# Patient Record
Sex: Female | Born: 1955 | Race: White | Hispanic: No | Marital: Married | Smoking: Never smoker
Health system: Southern US, Community
[De-identification: ages and names within clinical notes are randomized; demographics above are authoritative.]

## PROBLEM LIST (undated history)

## (undated) DIAGNOSIS — F329 Major depressive disorder, single episode, unspecified: Secondary | ICD-10-CM

## (undated) DIAGNOSIS — I10 Essential (primary) hypertension: Secondary | ICD-10-CM

## (undated) DIAGNOSIS — C801 Malignant (primary) neoplasm, unspecified: Secondary | ICD-10-CM

## (undated) DIAGNOSIS — E785 Hyperlipidemia, unspecified: Secondary | ICD-10-CM

## (undated) DIAGNOSIS — E079 Disorder of thyroid, unspecified: Secondary | ICD-10-CM

## (undated) DIAGNOSIS — F32A Depression, unspecified: Secondary | ICD-10-CM

## (undated) HISTORY — DX: Disorder of thyroid, unspecified: E07.9

## (undated) HISTORY — DX: Essential (primary) hypertension: I10

## (undated) HISTORY — PX: UPPER GASTROINTESTINAL ENDOSCOPY: SHX188

## (undated) HISTORY — DX: Hyperlipidemia, unspecified: E78.5

## (undated) HISTORY — DX: Malignant (primary) neoplasm, unspecified: C80.1

## (undated) HISTORY — PX: SKIN CANCER EXCISION: SHX779

## (undated) HISTORY — DX: Depression, unspecified: F32.A

---
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
## 1998-08-27 ENCOUNTER — Ambulatory Visit (HOSPITAL_COMMUNITY): Admission: RE | Admit: 1998-08-27 | Discharge: 1998-08-27 | Payer: Self-pay | Admitting: Obstetrics and Gynecology

## 1999-05-12 ENCOUNTER — Other Ambulatory Visit: Admission: RE | Admit: 1999-05-12 | Discharge: 1999-05-12 | Payer: Self-pay | Admitting: Obstetrics and Gynecology

## 1999-09-01 ENCOUNTER — Encounter: Payer: Self-pay | Admitting: Obstetrics and Gynecology

## 1999-09-01 ENCOUNTER — Ambulatory Visit (HOSPITAL_COMMUNITY): Admission: RE | Admit: 1999-09-01 | Discharge: 1999-09-01 | Payer: Self-pay | Admitting: Obstetrics and Gynecology

## 2000-06-21 ENCOUNTER — Other Ambulatory Visit: Admission: RE | Admit: 2000-06-21 | Discharge: 2000-06-21 | Payer: Self-pay | Admitting: Obstetrics and Gynecology

## 2000-09-06 ENCOUNTER — Encounter: Payer: Self-pay | Admitting: Obstetrics and Gynecology

## 2000-09-06 ENCOUNTER — Ambulatory Visit (HOSPITAL_COMMUNITY): Admission: RE | Admit: 2000-09-06 | Discharge: 2000-09-06 | Payer: Self-pay | Admitting: Obstetrics and Gynecology

## 2000-11-10 ENCOUNTER — Other Ambulatory Visit: Admission: RE | Admit: 2000-11-10 | Discharge: 2000-11-10 | Payer: Self-pay | Admitting: Obstetrics and Gynecology

## 2001-09-22 ENCOUNTER — Other Ambulatory Visit: Admission: RE | Admit: 2001-09-22 | Discharge: 2001-09-22 | Payer: Self-pay | Admitting: Obstetrics and Gynecology

## 2002-10-26 ENCOUNTER — Other Ambulatory Visit: Admission: RE | Admit: 2002-10-26 | Discharge: 2002-10-26 | Payer: Self-pay | Admitting: Obstetrics and Gynecology

## 2003-11-05 ENCOUNTER — Other Ambulatory Visit: Admission: RE | Admit: 2003-11-05 | Discharge: 2003-11-05 | Payer: Self-pay | Admitting: Obstetrics and Gynecology

## 2004-11-27 ENCOUNTER — Other Ambulatory Visit: Admission: RE | Admit: 2004-11-27 | Discharge: 2004-11-27 | Payer: Self-pay | Admitting: Obstetrics and Gynecology

## 2006-02-17 ENCOUNTER — Other Ambulatory Visit: Admission: RE | Admit: 2006-02-17 | Discharge: 2006-02-17 | Payer: Self-pay | Admitting: Obstetrics and Gynecology

## 2008-05-08 ENCOUNTER — Ambulatory Visit: Payer: Self-pay | Admitting: Gastroenterology

## 2008-05-08 DIAGNOSIS — L255 Unspecified contact dermatitis due to plants, except food: Secondary | ICD-10-CM | POA: Insufficient documentation

## 2008-05-08 DIAGNOSIS — K589 Irritable bowel syndrome without diarrhea: Secondary | ICD-10-CM | POA: Insufficient documentation

## 2008-05-08 DIAGNOSIS — K644 Residual hemorrhoidal skin tags: Secondary | ICD-10-CM | POA: Insufficient documentation

## 2008-05-08 DIAGNOSIS — K625 Hemorrhage of anus and rectum: Secondary | ICD-10-CM | POA: Insufficient documentation

## 2008-05-14 ENCOUNTER — Ambulatory Visit: Payer: Self-pay | Admitting: Gastroenterology

## 2008-05-14 ENCOUNTER — Encounter: Payer: Self-pay | Admitting: Gastroenterology

## 2008-05-14 HISTORY — PX: COLONOSCOPY: SHX174

## 2008-05-17 ENCOUNTER — Encounter: Payer: Self-pay | Admitting: Gastroenterology

## 2008-05-23 ENCOUNTER — Encounter: Payer: Self-pay | Admitting: Gastroenterology

## 2008-05-23 ENCOUNTER — Ambulatory Visit: Payer: Self-pay | Admitting: Gastroenterology

## 2008-05-25 ENCOUNTER — Encounter: Payer: Self-pay | Admitting: Gastroenterology

## 2012-08-03 ENCOUNTER — Other Ambulatory Visit: Payer: Self-pay | Admitting: Dermatology

## 2014-02-08 ENCOUNTER — Other Ambulatory Visit: Payer: Self-pay | Admitting: Obstetrics and Gynecology

## 2014-02-08 DIAGNOSIS — R928 Other abnormal and inconclusive findings on diagnostic imaging of breast: Secondary | ICD-10-CM

## 2014-02-09 ENCOUNTER — Ambulatory Visit
Admission: RE | Admit: 2014-02-09 | Discharge: 2014-02-09 | Disposition: A | Discharge status: Home / Self Care | Payer: BC Managed Care – PPO | Source: Ambulatory Visit | Attending: Obstetrics and Gynecology | Admitting: Obstetrics and Gynecology

## 2014-02-09 DIAGNOSIS — R928 Other abnormal and inconclusive findings on diagnostic imaging of breast: Secondary | ICD-10-CM

## 2014-02-15 ENCOUNTER — Other Ambulatory Visit: Payer: Self-pay

## 2015-03-13 ENCOUNTER — Other Ambulatory Visit: Payer: Self-pay | Admitting: Obstetrics and Gynecology

## 2015-03-14 LAB — CYTOLOGY - PAP

## 2015-09-09 ENCOUNTER — Other Ambulatory Visit: Payer: Self-pay

## 2016-02-04 DIAGNOSIS — R030 Elevated blood-pressure reading, without diagnosis of hypertension: Secondary | ICD-10-CM | POA: Insufficient documentation

## 2016-02-10 DIAGNOSIS — F419 Anxiety disorder, unspecified: Secondary | ICD-10-CM | POA: Insufficient documentation

## 2016-02-10 DIAGNOSIS — E039 Hypothyroidism, unspecified: Secondary | ICD-10-CM | POA: Insufficient documentation

## 2016-05-25 DIAGNOSIS — E039 Hypothyroidism, unspecified: Secondary | ICD-10-CM | POA: Diagnosis not present

## 2016-05-25 DIAGNOSIS — I10 Essential (primary) hypertension: Secondary | ICD-10-CM | POA: Diagnosis not present

## 2016-06-02 DIAGNOSIS — E7800 Pure hypercholesterolemia, unspecified: Secondary | ICD-10-CM | POA: Diagnosis not present

## 2016-06-02 DIAGNOSIS — I10 Essential (primary) hypertension: Secondary | ICD-10-CM | POA: Diagnosis not present

## 2016-06-02 DIAGNOSIS — E039 Hypothyroidism, unspecified: Secondary | ICD-10-CM | POA: Diagnosis not present

## 2016-06-04 DIAGNOSIS — H524 Presbyopia: Secondary | ICD-10-CM | POA: Diagnosis not present

## 2016-06-04 DIAGNOSIS — H5203 Hypermetropia, bilateral: Secondary | ICD-10-CM | POA: Diagnosis not present

## 2016-08-11 DIAGNOSIS — H10411 Chronic giant papillary conjunctivitis, right eye: Secondary | ICD-10-CM | POA: Diagnosis not present

## 2016-10-06 ENCOUNTER — Telehealth: Payer: Self-pay | Admitting: Internal Medicine

## 2016-10-06 NOTE — Telephone Encounter (Signed)
Colon recall is 04/2018

## 2016-10-06 NOTE — Telephone Encounter (Signed)
Recall entered  Left message for patient to call back

## 2016-10-06 NOTE — Telephone Encounter (Signed)
Please review colonoscopy from 2009 and indicate correct recall date.

## 2016-10-12 NOTE — Telephone Encounter (Signed)
Patient notified

## 2016-12-15 DIAGNOSIS — Z23 Encounter for immunization: Secondary | ICD-10-CM | POA: Diagnosis not present

## 2017-02-10 DIAGNOSIS — I10 Essential (primary) hypertension: Secondary | ICD-10-CM | POA: Diagnosis not present

## 2017-04-21 DIAGNOSIS — D229 Melanocytic nevi, unspecified: Secondary | ICD-10-CM | POA: Diagnosis not present

## 2017-04-21 DIAGNOSIS — L814 Other melanin hyperpigmentation: Secondary | ICD-10-CM | POA: Diagnosis not present

## 2017-04-21 DIAGNOSIS — L821 Other seborrheic keratosis: Secondary | ICD-10-CM | POA: Diagnosis not present

## 2017-08-02 DIAGNOSIS — Z01419 Encounter for gynecological examination (general) (routine) without abnormal findings: Secondary | ICD-10-CM | POA: Diagnosis not present

## 2017-08-02 DIAGNOSIS — Z1231 Encounter for screening mammogram for malignant neoplasm of breast: Secondary | ICD-10-CM | POA: Diagnosis not present

## 2017-08-02 DIAGNOSIS — Z13820 Encounter for screening for osteoporosis: Secondary | ICD-10-CM | POA: Diagnosis not present

## 2017-08-02 DIAGNOSIS — Z6826 Body mass index (BMI) 26.0-26.9, adult: Secondary | ICD-10-CM | POA: Diagnosis not present

## 2017-10-13 DIAGNOSIS — H524 Presbyopia: Secondary | ICD-10-CM | POA: Diagnosis not present

## 2017-10-13 DIAGNOSIS — H5203 Hypermetropia, bilateral: Secondary | ICD-10-CM | POA: Diagnosis not present

## 2018-02-08 DIAGNOSIS — I10 Essential (primary) hypertension: Secondary | ICD-10-CM | POA: Diagnosis not present

## 2018-02-08 DIAGNOSIS — F329 Major depressive disorder, single episode, unspecified: Secondary | ICD-10-CM | POA: Diagnosis not present

## 2018-02-08 DIAGNOSIS — F419 Anxiety disorder, unspecified: Secondary | ICD-10-CM | POA: Diagnosis not present

## 2018-02-08 DIAGNOSIS — E039 Hypothyroidism, unspecified: Secondary | ICD-10-CM | POA: Diagnosis not present

## 2018-02-15 DIAGNOSIS — I10 Essential (primary) hypertension: Secondary | ICD-10-CM | POA: Diagnosis not present

## 2018-02-15 DIAGNOSIS — Z13220 Encounter for screening for lipoid disorders: Secondary | ICD-10-CM | POA: Diagnosis not present

## 2018-02-15 DIAGNOSIS — E039 Hypothyroidism, unspecified: Secondary | ICD-10-CM | POA: Diagnosis not present

## 2018-02-21 ENCOUNTER — Other Ambulatory Visit: Payer: Self-pay | Admitting: Dermatology

## 2018-02-21 DIAGNOSIS — D0439 Carcinoma in situ of skin of other parts of face: Secondary | ICD-10-CM | POA: Diagnosis not present

## 2018-02-21 DIAGNOSIS — D485 Neoplasm of uncertain behavior of skin: Secondary | ICD-10-CM | POA: Diagnosis not present

## 2018-04-21 DIAGNOSIS — D0439 Carcinoma in situ of skin of other parts of face: Secondary | ICD-10-CM | POA: Diagnosis not present

## 2018-05-30 DIAGNOSIS — I10 Essential (primary) hypertension: Secondary | ICD-10-CM | POA: Diagnosis not present

## 2018-06-03 DIAGNOSIS — Z0000 Encounter for general adult medical examination without abnormal findings: Secondary | ICD-10-CM | POA: Diagnosis not present

## 2018-06-22 ENCOUNTER — Encounter: Payer: Self-pay | Admitting: Internal Medicine

## 2018-07-04 DIAGNOSIS — D0439 Carcinoma in situ of skin of other parts of face: Secondary | ICD-10-CM | POA: Diagnosis not present

## 2018-08-03 DIAGNOSIS — Z01419 Encounter for gynecological examination (general) (routine) without abnormal findings: Secondary | ICD-10-CM | POA: Diagnosis not present

## 2018-08-03 DIAGNOSIS — Z1231 Encounter for screening mammogram for malignant neoplasm of breast: Secondary | ICD-10-CM | POA: Diagnosis not present

## 2018-08-03 DIAGNOSIS — Z6826 Body mass index (BMI) 26.0-26.9, adult: Secondary | ICD-10-CM | POA: Diagnosis not present

## 2018-09-26 DIAGNOSIS — D0439 Carcinoma in situ of skin of other parts of face: Secondary | ICD-10-CM | POA: Diagnosis not present

## 2018-10-24 ENCOUNTER — Other Ambulatory Visit: Payer: Self-pay

## 2019-05-04 ENCOUNTER — Other Ambulatory Visit: Payer: Self-pay

## 2019-05-04 ENCOUNTER — Telehealth: Payer: Self-pay | Admitting: Psychiatry

## 2019-05-04 MED ORDER — ALPRAZOLAM 1 MG PO TABS: ORAL_TABLET | ORAL | 0 refills | Status: DC

## 2019-05-04 NOTE — Telephone Encounter (Signed)
Will need to pull paper chart no records in epic

## 2019-05-04 NOTE — Telephone Encounter (Signed)
Submitted for Alprazolam 1mg  take as needed for flying, #30

## 2019-05-04 NOTE — Telephone Encounter (Signed)
Pt [facility] [place] [place]. She has scheduled an appt for august.

## 2019-05-10 DIAGNOSIS — Z20828 Contact with and (suspected) exposure to other viral communicable diseases: Secondary | ICD-10-CM | POA: Diagnosis not present

## 2019-06-06 DIAGNOSIS — E785 Hyperlipidemia, unspecified: Secondary | ICD-10-CM | POA: Diagnosis not present

## 2019-06-06 DIAGNOSIS — E039 Hypothyroidism, unspecified: Secondary | ICD-10-CM | POA: Diagnosis not present

## 2019-06-06 DIAGNOSIS — I10 Essential (primary) hypertension: Secondary | ICD-10-CM | POA: Diagnosis not present

## 2019-06-06 DIAGNOSIS — Z0000 Encounter for general adult medical examination without abnormal findings: Secondary | ICD-10-CM | POA: Diagnosis not present

## 2019-06-09 DIAGNOSIS — Z0000 Encounter for general adult medical examination without abnormal findings: Secondary | ICD-10-CM | POA: Diagnosis not present

## 2019-06-15 DIAGNOSIS — R17 Unspecified jaundice: Secondary | ICD-10-CM | POA: Diagnosis not present

## 2019-07-04 ENCOUNTER — Ambulatory Visit: Payer: Self-pay | Admitting: Psychiatry

## 2019-07-12 DIAGNOSIS — R600 Localized edema: Secondary | ICD-10-CM | POA: Diagnosis not present

## 2019-07-12 DIAGNOSIS — I8312 Varicose veins of left lower extremity with inflammation: Secondary | ICD-10-CM | POA: Diagnosis not present

## 2019-07-12 DIAGNOSIS — I8311 Varicose veins of right lower extremity with inflammation: Secondary | ICD-10-CM | POA: Diagnosis not present

## 2019-08-03 ENCOUNTER — Encounter: Payer: Self-pay | Admitting: Internal Medicine

## 2019-08-15 DIAGNOSIS — Z1231 Encounter for screening mammogram for malignant neoplasm of breast: Secondary | ICD-10-CM | POA: Diagnosis not present

## 2019-08-22 DIAGNOSIS — Z23 Encounter for immunization: Secondary | ICD-10-CM | POA: Diagnosis not present

## 2019-08-23 ENCOUNTER — Telehealth: Payer: Self-pay | Admitting: Psychiatry

## 2019-08-23 ENCOUNTER — Other Ambulatory Visit: Payer: Self-pay

## 2019-08-23 ENCOUNTER — Other Ambulatory Visit: Payer: Self-pay | Admitting: Psychiatry

## 2019-08-23 NOTE — Telephone Encounter (Signed)
Pt requesting a refill on her Lexapro. Appt scheduled for 11/5. [facility] [place] [facility]

## 2019-08-24 ENCOUNTER — Other Ambulatory Visit: Payer: Self-pay

## 2019-08-24 DIAGNOSIS — Z13820 Encounter for screening for osteoporosis: Secondary | ICD-10-CM | POA: Diagnosis not present

## 2019-08-24 DIAGNOSIS — Z6825 Body mass index (BMI) 25.0-25.9, adult: Secondary | ICD-10-CM | POA: Diagnosis not present

## 2019-08-24 DIAGNOSIS — Z01419 Encounter for gynecological examination (general) (routine) without abnormal findings: Secondary | ICD-10-CM | POA: Diagnosis not present

## 2019-08-24 NOTE — Telephone Encounter (Signed)
I will send her order for lexapro 20 mg via fax, unable to submit through epic

## 2019-09-01 ENCOUNTER — Encounter (INDEPENDENT_AMBULATORY_CARE_PROVIDER_SITE_OTHER): Payer: Self-pay

## 2019-09-01 ENCOUNTER — Ambulatory Visit (AMBULATORY_SURGERY_CENTER): Payer: Self-pay | Admitting: *Deleted

## 2019-09-01 ENCOUNTER — Encounter: Payer: Self-pay | Admitting: Internal Medicine

## 2019-09-01 ENCOUNTER — Other Ambulatory Visit: Payer: Self-pay

## 2019-09-01 VITALS — Temp 96.6°F | Ht 69.75 in | Wt 179.0 lb

## 2019-09-01 DIAGNOSIS — Z1211 Encounter for screening for malignant neoplasm of colon: Secondary | ICD-10-CM

## 2019-09-01 NOTE — Progress Notes (Signed)
Patient is here in-person for PV. Patient denies any allergies to eggs or soy. Patient denies any problems with anesthesia/sedation. Patient denies any oxygen use at home. Patient denies taking any diet/weight loss medications or blood thinners. Patient is not being treated for MRSA or C-diff. EMMI education assisgned to patient on colonoscopy, this was explained and instructions given to patient.   Pt is aware that care partner will wait in the car during procedure; if they feel like they will be too hot or cold to wait in the car; they may wait in the 4 th floor lobby. Patient is aware to bring only one care partner. We want them to wear a mask (we do not have any that we can provide them), practice social distancing, and we will check their temperatures when they get here.  I did remind the patient that their care partner needs to stay in the parking lot the entire time and have a cell phone available, we will call them when the pt is ready for discharge. Patient will wear mask into building.   Patient declined Covid testing at this time.

## 2019-09-11 ENCOUNTER — Telehealth: Payer: Self-pay

## 2019-09-11 NOTE — Telephone Encounter (Signed)
Covid-19 screening questions   Do you now or have you had a fever in the last 14 days? NO   Do you have any respiratory symptoms of shortness of breath or cough now or in the last 14 days? NO  Do you have any family members or close contacts with diagnosed or suspected Covid-19 in the past 14 days? NO  Have you been tested for Covid-19 and found to be positive? NO        

## 2019-09-12 ENCOUNTER — Telehealth: Payer: Self-pay

## 2019-09-12 ENCOUNTER — Other Ambulatory Visit: Payer: Self-pay

## 2019-09-12 ENCOUNTER — Encounter: Payer: Self-pay | Admitting: Internal Medicine

## 2019-09-12 ENCOUNTER — Ambulatory Visit (AMBULATORY_SURGERY_CENTER): Payer: BC Managed Care – PPO | Admitting: Internal Medicine

## 2019-09-12 VITALS — BP 106/76 | HR 59 | Temp 97.7°F | Resp 16 | Ht 69.75 in | Wt 179.0 lb

## 2019-09-12 DIAGNOSIS — K625 Hemorrhage of anus and rectum: Secondary | ICD-10-CM | POA: Diagnosis not present

## 2019-09-12 DIAGNOSIS — Z1211 Encounter for screening for malignant neoplasm of colon: Secondary | ICD-10-CM | POA: Diagnosis not present

## 2019-09-12 MED ORDER — SODIUM CHLORIDE 0.9 % IV SOLN: 500.0000 mL | Freq: Once | INTRAVENOUS | Status: DC

## 2019-09-12 NOTE — Telephone Encounter (Signed)
Follow up call attempted.  NALM  

## 2019-09-12 NOTE — Op Note (Signed)
[place] Patient Name: Angel Cox Procedure Date: 09/12/2019 8:36 AM MRN: [id number] Endoscopist: Gatha Mayer , MD Age: 63 Referring MD:  Date of Birth: 1955/12/21 Gender: Female Account #: [id number] Procedure:                Colonoscopy Indications:              Screening for colorectal malignant neoplasm, Last                            colonoscopy: 2009 Medicines:                Propofol per Anesthesia, Monitored Anesthesia Care Procedure:                Pre-Anesthesia Assessment:                           - Prior to the procedure, a History and Physical                            was performed, and patient medications and                            allergies were reviewed. The patient's tolerance of                            previous anesthesia was also reviewed. The risks                            and benefits of the procedure and the sedation                            options and risks were discussed with the patient.                            All questions were answered, and informed consent                            was obtained. Prior Anticoagulants: The patient has                            taken no previous anticoagulant or antiplatelet                            agents. ASA Grade Assessment: II - A patient with                            mild systemic disease. After reviewing the risks                            and benefits, the patient was deemed in                            satisfactory condition to undergo the procedure.  After obtaining informed consent, the colonoscope                            was passed under direct vision. Throughout the                            procedure, the patient's blood pressure, pulse, and                            oxygen saturations were monitored continuously. The                            Colonoscope was introduced through the anus and                            advanced to the  the cecum, identified by                            appendiceal orifice and ileocecal valve. The                            quality of the bowel preparation was good. The                            colonoscopy was performed without difficulty. The                            patient tolerated the procedure well. The bowel                            preparation used was Miralax via split dose                            instruction. Scope In: 8:51:14 AM Scope Out: 9:03:37 AM Scope Withdrawal Time: 0 hours 9 minutes 59 seconds  Total Procedure Duration: 0 hours 12 minutes 23 seconds  Findings:                 The perianal and digital rectal examinations were                            normal.                           The colon (entire examined portion) appeared normal.                           No additional abnormalities were found on                            retroflexion. Complications:            No immediate complications. Estimated blood loss:                            None. Estimated Blood Loss:     Estimated  blood loss: none. Recommendation:           - Repeat colonoscopy in 10 years for screening                            purposes.                           - Patient has a contact number available for                            emergencies. The signs and symptoms of potential                            delayed complications were discussed with the                            patient. Return to normal activities tomorrow.                            Written discharge instructions were provided to the                            patient.                           - Resume previous diet.                           - Continue present medications. Gatha Mayer, MD 09/12/2019 9:08:14 AM This report has been signed electronically.

## 2019-09-12 NOTE — Progress Notes (Signed)
Report given to PACU, vss 

## 2019-09-12 NOTE — Patient Instructions (Addendum)
The colonoscopy was normal. No polyps or cancer seen.  Next routine colonoscopy or other screening test in 10 years - 2030  I appreciate the opportunity to care for you. Gatha Mayer, MD, FACG  YOU HAD AN ENDOSCOPIC PROCEDURE TODAY AT [facility]:   Refer to the procedure report that was given to you for any specific questions about what was found during the examination.  If the procedure report does not answer your questions, please call your gastroenterologist to clarify.  If you requested that your care partner not be given the details of your procedure findings, then the procedure report has been included in a sealed envelope for you to review at your convenience later.  YOU SHOULD EXPECT: Some feelings of bloating in the abdomen. Passage of more gas than usual.  Walking can help get rid of the air that was put into your GI tract during the procedure and reduce the bloating. If you had a lower endoscopy (such as a colonoscopy or flexible sigmoidoscopy) you may notice spotting of blood in your stool or on the toilet paper. If you underwent a bowel prep for your procedure, you may not have a normal bowel movement for a few days.  Please Note:  You might notice some irritation and congestion in your nose or some drainage.  This is from the oxygen used during your procedure.  There is no need for concern and it should clear up in a day or so.  SYMPTOMS TO REPORT IMMEDIATELY:   Following lower endoscopy (colonoscopy or flexible sigmoidoscopy):  Excessive amounts of blood in the stool  Significant tenderness or worsening of abdominal pains  Swelling of the abdomen that is new, acute  Fever of 100F or higher  For urgent or emergent issues, a gastroenterologist can be reached at any hour by calling [phone].   DIET:  We do recommend a small meal at first, but then you may proceed to your regular diet.  Drink plenty of fluids but you should avoid alcoholic beverages  for 24 hours.  ACTIVITY:  You should plan to take it easy for the rest of today and you should NOT DRIVE or use heavy machinery until tomorrow (because of the sedation medicines used during the test).    FOLLOW UP: Our staff will call the number listed on your records 48-72 hours following your procedure to check on you and address any questions or concerns that you may have regarding the information given to you following your procedure. If we do not reach you, we will leave a message.  We will attempt to reach you two times.  During this call, we will ask if you have developed any symptoms of COVID 19. If you develop any symptoms (ie: fever, flu-like symptoms, shortness of breath, cough etc.) before then, please call [phone].  If you test positive for Covid 19 in the 2 weeks post procedure, please call and report this information to [place].    If any biopsies were taken you will be contacted by phone or by letter within the next 1-3 weeks.  Please call us at [phone] if you have not heard about the biopsies in 3 weeks.    SIGNATURES/CONFIDENTIALITY: You and/or your care partner have signed paperwork which will be entered into your electronic medical record.  These signatures attest to the fact that that the information above on your After Visit Summary has been reviewed and is understood.  Full responsibility of the confidentiality of  this discharge information lies with you and/or your care-partner.

## 2019-09-12 NOTE — Progress Notes (Signed)
[place]   Pt's states no medical or surgical changes since previsit or office visit.

## 2019-09-14 ENCOUNTER — Telehealth: Payer: Self-pay | Admitting: *Deleted

## 2019-09-14 NOTE — Telephone Encounter (Signed)
  Follow up Call-  Call back number 09/12/2019  Post procedure Call Back phone  # [phone] cell  Permission to leave phone message Yes  Some recent data might be hidden     Patient questions:  Do you have a fever, pain , or abdominal swelling? No. Pain Score  0 *  Have you tolerated food without any problems? Yes.    Have you been able to return to your normal activities? Yes.    Do you have any questions about your discharge instructions: Diet   No. Medications  No. Follow up visit  No.  Do you have questions or concerns about your Care? No.  Actions: * If pain score is 4 or above: No action needed, pain <4.   1. Have you developed a fever since your procedure? no  2.   Have you had an respiratory symptoms (SOB or cough) since your procedure? no  3.   Have you tested positive for COVID 19 since your procedure no  4.   Have you had any family members/close contacts diagnosed with the COVID 19 since your procedure?  no   If yes to any of these questions please route to Joylene John, RN and Alphonsa Gin, Therapist, sports.

## 2019-09-18 ENCOUNTER — Other Ambulatory Visit: Payer: Self-pay

## 2019-09-18 MED ORDER — ESCITALOPRAM OXALATE 20 MG PO TABS: 20.0000 mg | ORAL_TABLET | Freq: Every day | ORAL | 1 refills | Status: DC

## 2019-09-21 ENCOUNTER — Other Ambulatory Visit: Payer: Self-pay

## 2019-09-21 ENCOUNTER — Encounter: Payer: Self-pay | Admitting: Psychiatry

## 2019-10-16 NOTE — Progress Notes (Signed)
This encounter was created in error - please disregard.

## 2019-11-23 DIAGNOSIS — Z23 Encounter for immunization: Secondary | ICD-10-CM | POA: Diagnosis not present

## 2020-01-04 ENCOUNTER — Other Ambulatory Visit: Payer: Self-pay | Admitting: Internal Medicine

## 2020-01-04 DIAGNOSIS — I10 Essential (primary) hypertension: Secondary | ICD-10-CM

## 2020-01-04 DIAGNOSIS — E785 Hyperlipidemia, unspecified: Secondary | ICD-10-CM

## 2020-01-16 ENCOUNTER — Ambulatory Visit
Admission: RE | Admit: 2020-01-16 | Discharge: 2020-01-16 | Disposition: A | Discharge status: Home / Self Care | Payer: No Typology Code available for payment source | Source: Ambulatory Visit | Attending: Internal Medicine | Admitting: Internal Medicine

## 2020-01-16 DIAGNOSIS — E785 Hyperlipidemia, unspecified: Secondary | ICD-10-CM | POA: Diagnosis not present

## 2020-01-16 DIAGNOSIS — I10 Essential (primary) hypertension: Secondary | ICD-10-CM

## 2020-01-30 DIAGNOSIS — M25531 Pain in right wrist: Secondary | ICD-10-CM | POA: Diagnosis not present

## 2020-02-05 DIAGNOSIS — M25531 Pain in right wrist: Secondary | ICD-10-CM | POA: Diagnosis not present

## 2020-02-20 ENCOUNTER — Other Ambulatory Visit: Payer: Self-pay | Admitting: Psychiatry

## 2020-02-20 DIAGNOSIS — W57XXXA Bitten or stung by nonvenomous insect and other nonvenomous arthropods, initial encounter: Secondary | ICD-10-CM | POA: Diagnosis not present

## 2020-02-20 DIAGNOSIS — L03116 Cellulitis of left lower limb: Secondary | ICD-10-CM | POA: Diagnosis not present

## 2020-02-20 NOTE — Telephone Encounter (Signed)
Check last apt 

## 2020-02-28 DIAGNOSIS — I8312 Varicose veins of left lower extremity with inflammation: Secondary | ICD-10-CM | POA: Diagnosis not present

## 2020-02-28 DIAGNOSIS — I8311 Varicose veins of right lower extremity with inflammation: Secondary | ICD-10-CM | POA: Diagnosis not present

## 2020-03-06 DIAGNOSIS — M19039 Primary osteoarthritis, unspecified wrist: Secondary | ICD-10-CM | POA: Diagnosis not present

## 2020-03-06 DIAGNOSIS — M25531 Pain in right wrist: Secondary | ICD-10-CM | POA: Diagnosis not present

## 2020-05-13 ENCOUNTER — Other Ambulatory Visit: Payer: Self-pay | Admitting: Psychiatry

## 2020-05-13 ENCOUNTER — Telehealth: Payer: Self-pay | Admitting: Psychiatry

## 2020-05-13 MED ORDER — ALPRAZOLAM 1 MG PO TABS: ORAL_TABLET | ORAL | 0 refills | Status: DC

## 2020-05-13 NOTE — Telephone Encounter (Signed)
Maggie called asking if you would send in a prescription for Xanax for flying.  Obviously needs by end of the week.  [facility] [place].

## 2020-05-13 NOTE — Telephone Encounter (Signed)
Alprazolam prescription sent

## 2020-05-21 ENCOUNTER — Other Ambulatory Visit: Payer: Self-pay | Admitting: Psychiatry

## 2020-05-21 NOTE — Telephone Encounter (Signed)
Not seen in epic?

## 2020-07-04 DIAGNOSIS — I10 Essential (primary) hypertension: Secondary | ICD-10-CM | POA: Diagnosis not present

## 2020-07-04 DIAGNOSIS — Z0000 Encounter for general adult medical examination without abnormal findings: Secondary | ICD-10-CM | POA: Diagnosis not present

## 2020-07-04 DIAGNOSIS — E039 Hypothyroidism, unspecified: Secondary | ICD-10-CM | POA: Diagnosis not present

## 2020-07-11 DIAGNOSIS — Z0000 Encounter for general adult medical examination without abnormal findings: Secondary | ICD-10-CM | POA: Diagnosis not present

## 2020-07-11 DIAGNOSIS — E039 Hypothyroidism, unspecified: Secondary | ICD-10-CM | POA: Diagnosis not present

## 2020-07-11 DIAGNOSIS — I10 Essential (primary) hypertension: Secondary | ICD-10-CM | POA: Diagnosis not present

## 2020-08-20 DIAGNOSIS — E039 Hypothyroidism, unspecified: Secondary | ICD-10-CM | POA: Diagnosis not present

## 2020-08-20 DIAGNOSIS — I10 Essential (primary) hypertension: Secondary | ICD-10-CM | POA: Diagnosis not present

## 2020-09-19 DIAGNOSIS — Z01419 Encounter for gynecological examination (general) (routine) without abnormal findings: Secondary | ICD-10-CM | POA: Diagnosis not present

## 2020-09-19 DIAGNOSIS — Z6826 Body mass index (BMI) 26.0-26.9, adult: Secondary | ICD-10-CM | POA: Diagnosis not present

## 2020-09-19 DIAGNOSIS — Z1231 Encounter for screening mammogram for malignant neoplasm of breast: Secondary | ICD-10-CM | POA: Diagnosis not present

## 2020-10-01 ENCOUNTER — Other Ambulatory Visit: Payer: Self-pay | Admitting: Psychiatry

## 2020-11-06 ENCOUNTER — Telehealth: Payer: Self-pay | Admitting: Psychiatry

## 2020-11-06 ENCOUNTER — Other Ambulatory Visit: Payer: Self-pay | Admitting: Psychiatry

## 2020-11-06 MED ORDER — LITHIUM CARBONATE 150 MG PO CAPS: 150.0000 mg | ORAL_CAPSULE | Freq: Every evening | ORAL | 3 refills | Status: DC

## 2020-11-06 NOTE — Telephone Encounter (Signed)
Pt called and left a message stating that she needs a refill on her lithium low dose [facility] [place]

## 2020-11-26 DIAGNOSIS — I10 Essential (primary) hypertension: Secondary | ICD-10-CM | POA: Diagnosis not present

## 2020-11-26 DIAGNOSIS — Z8249 Family history of ischemic heart disease and other diseases of the circulatory system: Secondary | ICD-10-CM | POA: Diagnosis not present

## 2020-11-26 DIAGNOSIS — E785 Hyperlipidemia, unspecified: Secondary | ICD-10-CM | POA: Diagnosis not present

## 2020-11-26 DIAGNOSIS — E663 Overweight: Secondary | ICD-10-CM | POA: Diagnosis not present

## 2020-12-12 ENCOUNTER — Ambulatory Visit (INDEPENDENT_AMBULATORY_CARE_PROVIDER_SITE_OTHER): Payer: BC Managed Care – PPO

## 2020-12-12 ENCOUNTER — Other Ambulatory Visit: Payer: Self-pay

## 2020-12-12 ENCOUNTER — Ambulatory Visit (INDEPENDENT_AMBULATORY_CARE_PROVIDER_SITE_OTHER): Payer: BC Managed Care – PPO | Admitting: Podiatry

## 2020-12-12 DIAGNOSIS — M25572 Pain in left ankle and joints of left foot: Secondary | ICD-10-CM

## 2020-12-12 DIAGNOSIS — M79671 Pain in right foot: Secondary | ICD-10-CM

## 2020-12-12 DIAGNOSIS — M79672 Pain in left foot: Secondary | ICD-10-CM

## 2020-12-12 DIAGNOSIS — M722 Plantar fascial fibromatosis: Secondary | ICD-10-CM

## 2020-12-12 MED ORDER — DICLOFENAC SODIUM 75 MG PO TBEC: 75.0000 mg | DELAYED_RELEASE_TABLET | Freq: Two times a day (BID) | ORAL | 2 refills | Status: DC

## 2020-12-12 MED ORDER — TRIAMCINOLONE ACETONIDE 10 MG/ML IJ SUSP
10.0000 mg | Freq: Once | INTRAMUSCULAR | Status: AC
  Administered 2020-12-12: 10 mg

## 2020-12-12 NOTE — Patient Instructions (Signed)

## 2020-12-13 NOTE — Progress Notes (Signed)
Subjective:   Patient ID: Angel Cox, female   DOB: 65 y.o.   MRN: [id number]   HPI Patient presents with intense discomfort of several months duration right plantar heel and history of left ankle injury with history of turning her ankle.  She likes to be active and hike and it is affecting her ability to be active and the pain has gradually intensified on the right.  States the ankle has not done this for a fairly long time but she has had significant history.  Patient does not smoke likes to be active   Review of Systems  All other systems reviewed and are negative.       Objective:  Physical Exam Vitals and nursing note reviewed.  Constitutional:      Appearance: She is well-developed and well-nourished.  Cardiovascular:     Pulses: Intact distal pulses.  Pulmonary:     Effort: Pulmonary effort is normal.  Musculoskeletal:        General: Normal range of motion.  Skin:    General: Skin is warm.  Neurological:     Mental Status: She is alert.     Neurovascular status found to be intact muscle strength found to be adequate range of motion found to be adequate.  Patient is noted to have exquisite discomfort medial fascial band right and slightly distal and is noted to have moderate depression of the arch and on the left I noted there is some excessive ankle motion but not significant instability.  Patient is found to have good digital perfusion well oriented x3     Assessment:  Acute plantar fasciitis right with inflammation fluid along with mild to moderate ankle instability left     Plan:  H&P reviewed conditions and for the right I did sterile prep and injected the fascia at insertion 3 mg Kenalog 5 mg Xylocaine and for the left I have recommended strengthening exercises but do not recommend more aggressive treatment pattern.  Patient will be seen back 2 weeks and was given instructions for physical therapy supportive shoe and modification of shoe gear usage.  Also  placed oral anti-inflammatory at this time  X-rays indicate there is spur formation right over left heel no indication of instability of the ankle or diastases injury or arthritis secondary to previous sprains

## 2020-12-17 DIAGNOSIS — H5203 Hypermetropia, bilateral: Secondary | ICD-10-CM | POA: Diagnosis not present

## 2020-12-17 DIAGNOSIS — H524 Presbyopia: Secondary | ICD-10-CM | POA: Diagnosis not present

## 2021-02-10 ENCOUNTER — Other Ambulatory Visit: Payer: Self-pay | Admitting: Psychiatry

## 2021-02-11 NOTE — Telephone Encounter (Signed)
review 

## 2021-02-27 ENCOUNTER — Ambulatory Visit: Payer: BC Managed Care – PPO | Admitting: Podiatry

## 2021-03-06 ENCOUNTER — Other Ambulatory Visit: Payer: Self-pay

## 2021-03-06 ENCOUNTER — Ambulatory Visit (INDEPENDENT_AMBULATORY_CARE_PROVIDER_SITE_OTHER): Payer: BC Managed Care – PPO | Admitting: Podiatry

## 2021-03-06 DIAGNOSIS — M722 Plantar fascial fibromatosis: Secondary | ICD-10-CM | POA: Diagnosis not present

## 2021-03-07 NOTE — Progress Notes (Signed)
Subjective:   Patient ID: Angel Cox, female   DOB: 65 y.o.   MRN: [id number]   HPI Patient states she had relief for 1 week from her pain of her heel and it is now back as she is a very active person.  States it is worse when she gets up in the morning after periods of sitting and states that she was very optimistic but it has returned almost to the same degree   ROS      Objective:  Physical Exam  Neurovascular status intact with exquisite discomfort plantar aspect right heel at the insertional point tendon calcaneus inflammation fluid around the medial band with moderate depression of the arch     Assessment:  Acute plantar fasciitis right with inflammation fluid of the medial band     Plan:  H&P reviewed condition sterile prep we injected the plantar fascial right 3 mg dexamethasone Kenalog 5 mg Xylocaine dispensed night splint with all instructions on usage and begin aggressive ice therapy and went ahead today and casted for functional orthotics to reduce the stress on her heels.  Patient will be seen back when orthotics are ready

## 2021-03-10 ENCOUNTER — Telehealth: Payer: Self-pay | Admitting: *Deleted

## 2021-03-10 NOTE — Telephone Encounter (Signed)
Patient is calling and wanted to know if doctor would send in a prescription ([facility])for Pennsaid topical for the Plantar Fasciitis since he thought that the cream he had suggested in office may not work (tissue may be too tough).Please advise.

## 2021-03-11 ENCOUNTER — Telehealth: Payer: Self-pay | Admitting: Psychiatry

## 2021-03-11 NOTE — Telephone Encounter (Signed)
Returned call to patient and informed per doctor that prescription requested will not be beneficial for her. Verbalized understanding.

## 2021-03-11 NOTE — Telephone Encounter (Signed)
Pt called and made an appt for 05/07/21. She would like to start on ADD medicine. She said that you have talked about starting this. [facility] [place]

## 2021-03-11 NOTE — Telephone Encounter (Signed)
That will not be beneficial for her

## 2021-03-11 NOTE — Telephone Encounter (Signed)
Please review

## 2021-03-11 NOTE — Telephone Encounter (Signed)
She is a friend of mine but I still cannot start a controlled substance without seeing her in an appointment first so we will have to wait until the appointment

## 2021-03-12 NOTE — Telephone Encounter (Signed)
LVM

## 2021-03-25 ENCOUNTER — Telehealth: Payer: Self-pay | Admitting: Psychiatry

## 2021-03-25 NOTE — Telephone Encounter (Signed)
Pt wants to know if you will see her at 5 pm instead of her regular time on June  23 ,22 at 3:30 pm. She has a conflict and wanted me to ask

## 2021-03-25 NOTE — Telephone Encounter (Signed)
No to the 5 PM request.  Move her to Friday, June 24 as a work in

## 2021-03-31 ENCOUNTER — Other Ambulatory Visit: Payer: BC Managed Care – PPO

## 2021-04-09 ENCOUNTER — Other Ambulatory Visit: Payer: BC Managed Care – PPO

## 2021-04-15 ENCOUNTER — Other Ambulatory Visit: Payer: Self-pay | Admitting: Psychiatry

## 2021-04-15 ENCOUNTER — Ambulatory Visit: Payer: BC Managed Care – PPO | Admitting: Dermatology

## 2021-05-07 ENCOUNTER — Encounter: Payer: Self-pay | Admitting: Psychiatry

## 2021-05-07 ENCOUNTER — Ambulatory Visit (INDEPENDENT_AMBULATORY_CARE_PROVIDER_SITE_OTHER): Payer: BC Managed Care – PPO | Admitting: Psychiatry

## 2021-05-07 ENCOUNTER — Other Ambulatory Visit: Payer: Self-pay

## 2021-05-07 DIAGNOSIS — F419 Anxiety disorder, unspecified: Secondary | ICD-10-CM | POA: Diagnosis not present

## 2021-05-07 DIAGNOSIS — F325 Major depressive disorder, single episode, in full remission: Secondary | ICD-10-CM

## 2021-05-07 DIAGNOSIS — F902 Attention-deficit hyperactivity disorder, combined type: Secondary | ICD-10-CM | POA: Diagnosis not present

## 2021-05-07 DIAGNOSIS — R030 Elevated blood-pressure reading, without diagnosis of hypertension: Secondary | ICD-10-CM | POA: Diagnosis not present

## 2021-05-07 MED ORDER — ALPRAZOLAM 1 MG PO TABS: ORAL_TABLET | ORAL | 0 refills | Status: DC

## 2021-05-07 MED ORDER — METHYLPHENIDATE HCL ER (OSM) 36 MG PO TBCR: 36.0000 mg | EXTENDED_RELEASE_TABLET | Freq: Every day | ORAL | 0 refills | Status: DC

## 2021-05-07 NOTE — Progress Notes (Signed)
Angel Cox [id number] 17-Apr-1956 65 y.o.  Subjective:   Patient ID:  Angel Cox is a 65 y.o. (DOB 1956/04/29) female.  Chief Complaint:  Chief Complaint  Patient presents with   ADD   Follow-up    Taper Lexapro    HPI Angel Cox presents to the office today for follow-up of wants to try to taper Lexapro if possible to see if needed.  Also concerns abougt ADD  Kids think she needs ADD treatment. Scattered.  Has 2 kids with ADD on Adderall or Vyvanse.  Recognizes it affects her in conversation.  Hard to finish projects.  Loses things. Angel Cox track of task completion.  Overwhelmed at times feeling scattered.  Patient reports stable mood and denies depressed or irritable moods.  Patient denies any recent difficulty with anxiety.  Patient denies difficulty with sleep initiation or maintenance. Denies appetite disturbance.  Patient reports that energy and motivation have been good.  Patient denies any difficulty with concentration.  Patient denies any suicidal ideation.    Past Psychiatric Medication Trials:  Lexapro, Prozac  Review of Systems:  Review of Systems  Cardiovascular:  Negative for palpitations.  Neurological:  Negative for tremors and weakness.   Medications: I have reviewed the patient's current medications.  Current Outpatient Medications  Medication Sig Dispense Refill   atorvastatin (LIPITOR) 10 MG tablet      doxycycline (VIBRAMYCIN) 100 MG capsule doxycycline hyclate 100 mg capsule  TAKE 1 CAPSULE BY MOUTH TWICE A DAY FOR 7 DAYS     escitalopram (LEXAPRO) 20 MG tablet TAKE 1 TABLET BY MOUTH EVERY DAY 90 tablet 2   levothyroxine (SYNTHROID) 150 MCG tablet Take 150 mcg by mouth daily.     lithium carbonate 150 MG capsule Take 1 capsule (150 mg total) by mouth at bedtime. 90 capsule 3   LORazepam (ATIVAN) 1 MG tablet 1 mg.     losartan (COZAAR) 25 MG tablet      methylphenidate (CONCERTA) 36 MG PO CR tablet Take 1 tablet (36 mg total) by  mouth daily. 30 tablet 0   olmesartan (BENICAR) 20 MG tablet Take 20 mg by mouth daily.     ALPRAZolam (XANAX) 1 MG tablet Take 1 tablet as needed for flying 30 tablet 0   No current facility-administered medications for this visit.    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   Please see review of systems for further details on the patient's review from today.   Objective:   Physical Exam:  There were no vitals taken for this visit.  Physical Exam Constitutional:      General: She is not in acute distress. Musculoskeletal:        General: No deformity.  Neurological:     Mental Status: She is alert and oriented to person, place, and time.     Coordination: Coordination normal.  Psychiatric:        Attention and Perception: Attention and perception normal. She does not perceive auditory or visual hallucinations.        Mood and Affect: Mood normal. Mood is not anxious or depressed. Affect is not  labile, blunt, angry or inappropriate.        Speech: Speech normal.        Behavior: Behavior normal.        Thought Content: Thought content normal. Thought content is not paranoid or delusional. Thought content does not include homicidal or suicidal ideation. Thought content does not include homicidal or suicidal plan.        Cognition and Memory: Cognition and memory normal.        Judgment: Judgment normal.     Comments: Insight intact    Lab Review:  No results found for: NA, K, CL, CO2, GLUCOSE, BUN, CREATININE, CALCIUM, PROT, ALBUMIN, AST, ALT, ALKPHOS, BILITOT, GFRNONAA, GFRAA  No results found for: WBC, RBC, HGB, HCT, PLT, MCV, MCH, MCHC, RDW, LYMPHSABS,  MONOABS, EOSABS, BASOSABS  No results found for: POCLITH, LITHIUM   No results found for: PHENYTOIN, PHENOBARB, VALPROATE, CBMZ   .res Assessment: Plan:    Angel Cox was seen today for add and follow-up.  Diagnoses and all orders for this visit:  Attention deficit hyperactivity disorder (ADHD), combined type -     methylphenidate (CONCERTA) 36 MG PO CR tablet; Take 1 tablet (36 mg total) by mouth daily.  Major depression in remission ([place])  Anxiety  Elevated blood-pressure reading without diagnosis of hypertension  Other orders -     ALPRAZolam (XANAX) 1 MG tablet; Take 1 tablet as needed for flying    Greater than 50% of face to face time with patient was spent on counseling and coordination of care. We discussed the diagnoses of history of depression and ADHD and their respective treatment options. Her depression is under control and has been under control for some time.  She is attempted to wean Lexapro without success in the past but some of what she experienced was SSRI withdrawal.  We discussed this in detail and how in the future when her medicine is weaned to optimize potential success she should reduce by 5 mg every 2 months.  She agrees with this plan.  She has a couple of 2 weeks trips upcoming 1 in July and 1 in September and it probably would make the most sense to wait until after that although we could start the process in August if she prefers.  We discussed the risk of relapse.  All 3 of her children have been diagnosed with ADD.  Her daughter is in the medical field and believes the patient has ADHD.  Angel Cox is known to me outside of the office setting and I have observed her ADHD symptoms as well. Adult self report scale for ADD HD score was 31 on the inattentive scale and 19 on the hyperactivity scale which makes the diagnosis highly likely.  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  We discussed this.  We discussed the goals with treatment should include  quality of life improvement and ease of productivity.  We discussed potential side effects of both major types of stimulants.  There are nonstimulant options as well.  Her children have benefited from amphetamine-based products.  However she prefers and I would recommend a long-acting ADHD medication.  She will have difficulty complying with twice daily or 3 times daily dosing.  Unfortunately [employer] this year has refused to cover Vyvanse which would be the first choice.  Therefore we will choose Concerta which is available as a generic.  It has a more more abrupt offset which can be a problem but otherwise is a reasonably  long-acting medication.  We discussed the side effects in detail including her questions around the risk of elevating blood pressure.  She will monitor for this.  She agrees to the plan. Start Concerta 36 mg every morning  Continue Lexapro 20 mg daily for now and will taper later as noted.  She takes alprazolam for flying phobia and she will be taking a couple of trips upcoming.  She is accustomed to using the medication and tolerates it.  Follow up in 3 months or so.  We will adjust the dose of the Concerta in between appointments.  Lynder Parents MD, DFAPA    Please see After Visit Summary for patient specific instructions.  Future Appointments  Date Time Provider [place]  08/19/2021 11:30 AM Cottle, Billey Co., MD [facility] None    No orders of the defined types were placed in this encounter.   -------------------------------

## 2021-05-26 ENCOUNTER — Other Ambulatory Visit: Payer: Self-pay

## 2021-05-26 ENCOUNTER — Ambulatory Visit (INDEPENDENT_AMBULATORY_CARE_PROVIDER_SITE_OTHER): Payer: BC Managed Care – PPO | Admitting: Podiatry

## 2021-05-26 DIAGNOSIS — M722 Plantar fascial fibromatosis: Secondary | ICD-10-CM

## 2021-05-26 DIAGNOSIS — M25572 Pain in left ankle and joints of left foot: Secondary | ICD-10-CM

## 2021-05-26 NOTE — Patient Instructions (Signed)

## 2021-05-26 NOTE — Progress Notes (Signed)
Patient presents today for orthotic pick up. Patient voices no new complaints.   Orthotics were fitted to patient's feet. No discomfort and no rubbing. Patient satisfied with the orthotics.   Orthotics were dispensed to patient with instructions for break in wear and to call the office if any concerns or questions.  

## 2021-07-28 ENCOUNTER — Ambulatory Visit (INDEPENDENT_AMBULATORY_CARE_PROVIDER_SITE_OTHER): Payer: BC Managed Care – PPO | Admitting: Podiatry

## 2021-07-28 ENCOUNTER — Encounter: Payer: Self-pay | Admitting: Podiatry

## 2021-07-28 ENCOUNTER — Other Ambulatory Visit: Payer: Self-pay

## 2021-07-28 DIAGNOSIS — M722 Plantar fascial fibromatosis: Secondary | ICD-10-CM

## 2021-07-28 MED ORDER — TRIAMCINOLONE ACETONIDE 10 MG/ML IJ SUSP
10.0000 mg | Freq: Once | INTRAMUSCULAR | Status: AC
  Administered 2021-07-28: 10 mg

## 2021-07-30 NOTE — Progress Notes (Signed)
Subjective:   Patient ID: Angel Cox, female   DOB: 65 y.o.   MRN: [id number]   HPI Patient states her right heel has become very tender and she is not been here for a while is getting ready to go out of town   ROS      Objective:  Physical Exam  Neuro vascular status intact inflammation pain of the right plantar fascial with patient getting ready to go to [place] acute     Assessment:  Fasciitis of the right plantar fascia     Plan:  Sterile prep injected the plantar fashion 3 mg Kenalog 5 mg Xylocaine advised on reduced activity reappoint to recheck

## 2021-08-19 ENCOUNTER — Ambulatory Visit (INDEPENDENT_AMBULATORY_CARE_PROVIDER_SITE_OTHER): Payer: BC Managed Care – PPO | Admitting: Psychiatry

## 2021-08-19 ENCOUNTER — Other Ambulatory Visit: Payer: Self-pay

## 2021-08-19 ENCOUNTER — Encounter: Payer: Self-pay | Admitting: Psychiatry

## 2021-08-19 DIAGNOSIS — F325 Major depressive disorder, single episode, in full remission: Secondary | ICD-10-CM | POA: Diagnosis not present

## 2021-08-19 DIAGNOSIS — F419 Anxiety disorder, unspecified: Secondary | ICD-10-CM

## 2021-08-19 DIAGNOSIS — F902 Attention-deficit hyperactivity disorder, combined type: Secondary | ICD-10-CM

## 2021-08-19 MED ORDER — LISDEXAMFETAMINE DIMESYLATE 20 MG PO CAPS: 20.0000 mg | ORAL_CAPSULE | Freq: Every day | ORAL | 0 refills | Status: AC

## 2021-08-19 NOTE — Progress Notes (Signed)
Angel Cox [id number] Sep 03, 1956 65 y.o.  Subjective:   Patient ID:  Angel Cox is a 65 y.o. (DOB 05/13/1956) female.  Chief Complaint:  Chief Complaint  Patient presents with   Follow-up   ADHD    HPI Angel Cox presents to the office today for follow-up of wants to try to taper Lexapro if possible to see if needed.  Also concerns abougt ADD  6/20222 appt. Kids think she needs ADD treatment. Scattered.  Has 2 kids with ADD on Adderall or Vyvanse.  Recognizes it affects her in conversation.  Hard to finish projects.  Loses things. Angel Cox track of task completion.  Overwhelmed at times feeling scattered. Started Concerta Start Concerta 36 mg every morning Continued Lexapro 20  08/19/2021 appt noted: Not taking Concerta. For 7 days and didn't feel good, head felt tight and less energetic.  Patient reports stable mood and denies depressed or irritable moods.  Patient denies any recent difficulty with anxiety.  Patient denies difficulty with sleep initiation or maintenance. Denies appetite disturbance.  Patient reports that energy and motivation have been good.  Patient denies any difficulty with concentration.  Patient denies any suicidal ideation.   Past Psychiatric Medication Trials:  Lexapro, Prozac, Wellbutrin Concerta 36 NR and SE  Review of Systems:  Review of Systems  Cardiovascular:  Negative for palpitations.  Neurological:  Negative for tremors and weakness.  Psychiatric/Behavioral:  Positive for decreased concentration. The patient is not nervous/anxious.    Medications: I have reviewed the patient's current medications.  Current Outpatient Medications  Medication Sig Dispense Refill   ALPRAZolam (XANAX) 1 MG tablet Take 1 tablet as needed for flying 30 tablet 0   atorvastatin (LIPITOR) 10 MG tablet      escitalopram (LEXAPRO) 20 MG tablet TAKE 1 TABLET BY MOUTH EVERY DAY 90 tablet 2   levothyroxine (SYNTHROID) 150 MCG tablet Take 150 mcg  by mouth daily.     lithium carbonate 150 MG capsule Take 1 capsule (150 mg total) by mouth at bedtime. 90 capsule 3   LORazepam (ATIVAN) 1 MG tablet 1 mg.     losartan (COZAAR) 25 MG tablet      methylphenidate (CONCERTA) 36 MG PO CR tablet Take 1 tablet (36 mg total) by mouth daily. (Patient not taking: Reported on 08/19/2021) 30 tablet 0   No current facility-administered medications for this visit.    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   Please see review of systems for further details on the patient's review from today.   Objective:   Physical Exam:  There were no vitals taken for this visit.  Physical Exam Constitutional:      General: She is not in acute distress. Musculoskeletal:        General: No deformity.  Neurological:     Mental Status: She is alert and oriented to person, place, and time.     Coordination: Coordination normal.  Psychiatric:        Attention and Perception: Attention and perception normal. She does not perceive auditory or visual hallucinations.        Mood and Affect:  Mood normal. Mood is not anxious or depressed. Affect is not labile, blunt, angry or inappropriate.        Speech: Speech normal. Speech is not rapid and pressured.        Behavior: Behavior normal.        Thought Content: Thought content normal. Thought content is not paranoid or delusional. Thought content does not include homicidal or suicidal ideation. Thought content does not include homicidal or suicidal plan.        Cognition and Memory: Cognition and memory normal.        Judgment: Judgment normal.     Comments: Insight intact    Lab Review:  No results  found for: NA, K, CL, CO2, GLUCOSE, BUN, CREATININE, CALCIUM, PROT, ALBUMIN, AST, ALT, ALKPHOS, BILITOT, GFRNONAA, GFRAA  No results found for: WBC, RBC, HGB, HCT, PLT, MCV, MCH, MCHC, RDW, LYMPHSABS, MONOABS, EOSABS, BASOSABS  No results found for: POCLITH, LITHIUM   No results found for: PHENYTOIN, PHENOBARB, VALPROATE, CBMZ   .res Assessment: Plan:    Angel Cox was seen today for follow-up and adhd.  Diagnoses and all orders for this visit:  Attention deficit hyperactivity disorder (ADHD), combined type  Major depression in remission (Angel Cox)  Anxiety    Greater than 50% of face to face time with patient was spent on counseling and coordination of care. We discussed the diagnoses of history of depression and ADHD and their respective treatment options. Her depression is under control and has been under control for some time.  She is attempted to wean Lexapro without success in the past but some of what she experienced was SSRI withdrawal.  We discussed this in detail and how in the future when her medicine is weaned to optimize potential success she should reduce by 5 mg every 2 months.  She agrees with this plan.   We discussed the risk of relapse.  All 3 of her children have been diagnosed with ADD.  Her daughter is in the medical field and believes the patient has ADHD.  Angel Cox is known to me outside of the office setting and I have observed her ADHD symptoms as well. Adult self report scale for ADD HD score was 31 on the inattentive scale and 19 on the hyperactivity scale which makes the diagnosis highly likely.  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  We discussed this.  We discussed the goals with treatment should include quality of life improvement and ease of productivity.  We discussed potential side effects of both major types of stimulants.  There are nonstimulant options as well.  Her children have benefited from amphetamine-based products.  However she prefers and I  would recommend a long-acting ADHD medication.  She will have difficulty complying with twice daily or 3 times daily dosing.   We discussed the side effects in detail including her questions around the risk of elevating blood pressure.  She will monitor for this.  She agrees to the plan. Concerta failed DT SE Vyvanse 20 mg AM  Continue Lexapro 20 mg daily for now and will taper later as noted.  She takes alprazolam for flying phobia and she will be taking a couple of trips upcoming.  She is accustomed to using the medication and tolerates it.  Follow up in 3 months or so.  We will adjust the dose of the Concerta in between appointments.  Lynder Parents MD, DFAPA    Please see After Visit Summary for patient specific instructions.  No future  appointments.   No orders of the defined types were placed in this encounter.   -------------------------------

## 2021-08-24 IMAGING — CT CT CARDIAC CORONARY ARTERY CALCIUM SCORE
3 series · 14 of 20 positions shown, 16 images · non-contrast
Comparison: None.

CLINICAL DATA: Hyperlipidemia

EXAM:
CT CARDIAC CORONARY ARTERY CALCIUM SCORE
TECHNIQUE: Non-contrast imaging through the heart was performed using
prospective ECG gating. Image post processing was performed on an
independent workstation, allowing for quantitative analysis of the
heart and coronary arteries. Note that this exam targets the heart
and the chest was not imaged in its entirety.

[Series 2: calcium scoring 2.00 qr36 bestdiast 71% · axial · 0.32mm/px · z∈[+1557,+1653]mm · 4 of 80 slices shown]
[im 16/80  vessel]
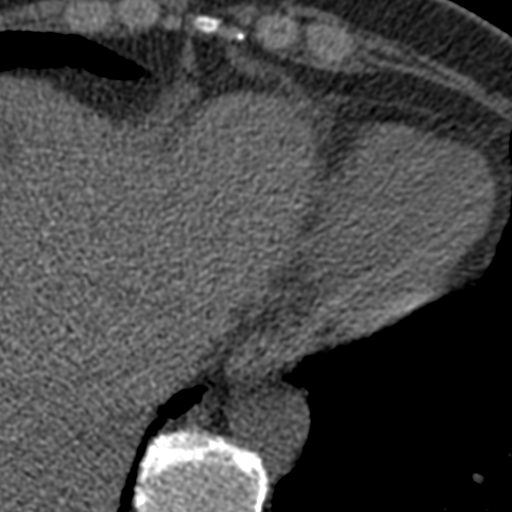
[im 32/80  vessel]
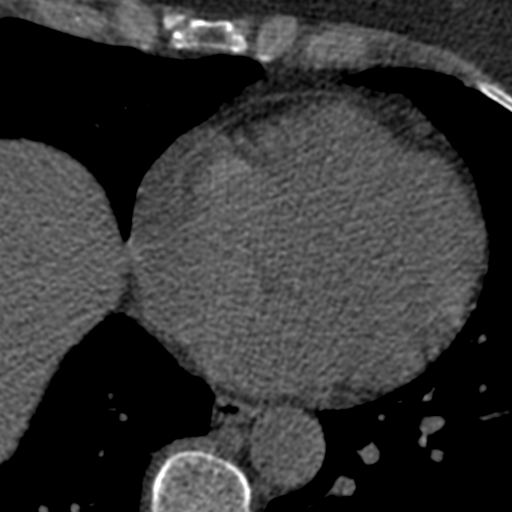
[im 48/80  vessel]
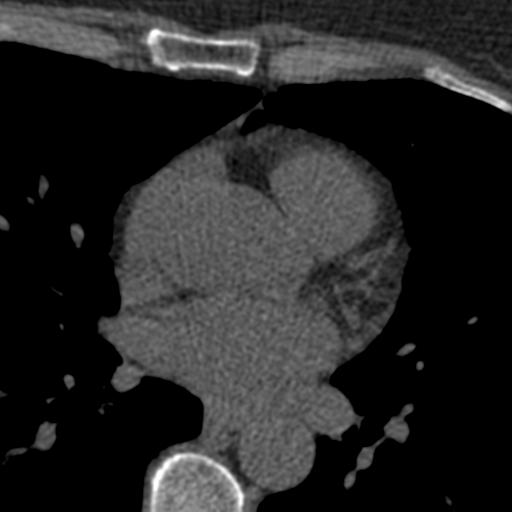
[im 64/80  vessel]
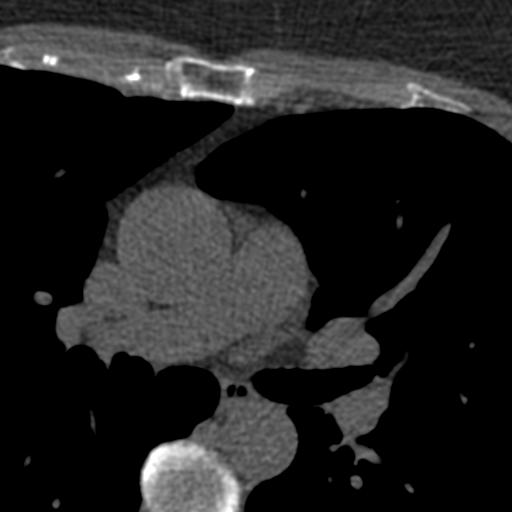

[Series 3: calcium scoring 2.00 br40 bestdiast 71% fov · axial · 0.52mm/px · z∈[+1553,+1657]mm · 5 of 80 slices shown, 7 images]
[im 14/80  vessel]
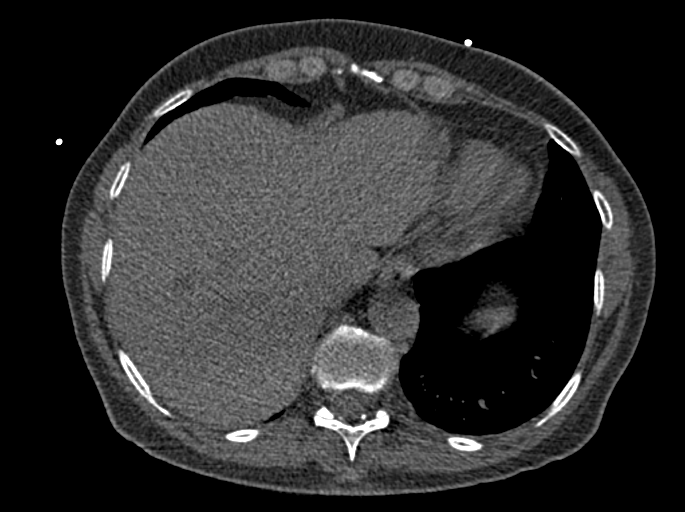
[im 14/80  lung]
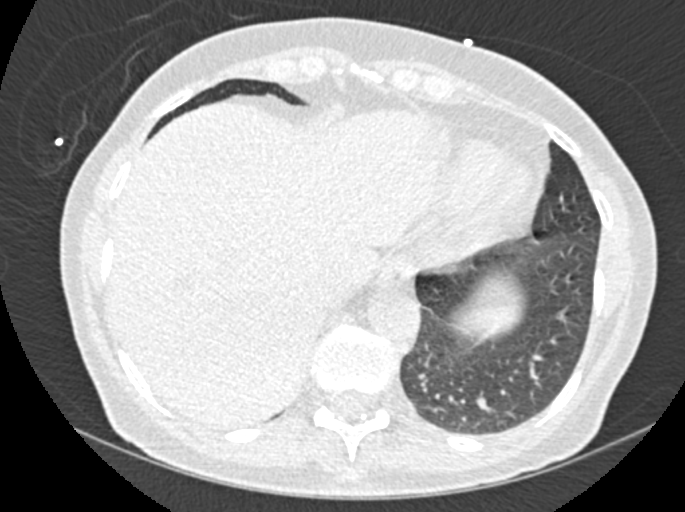
[im 27/80  vessel]
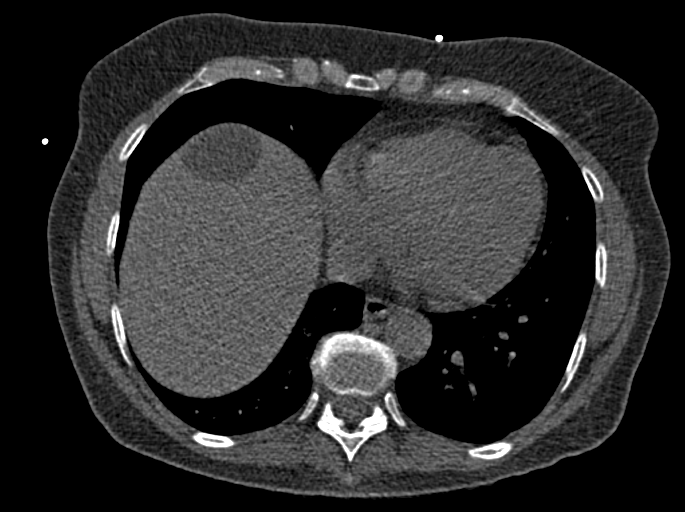
[im 40/80  vessel]
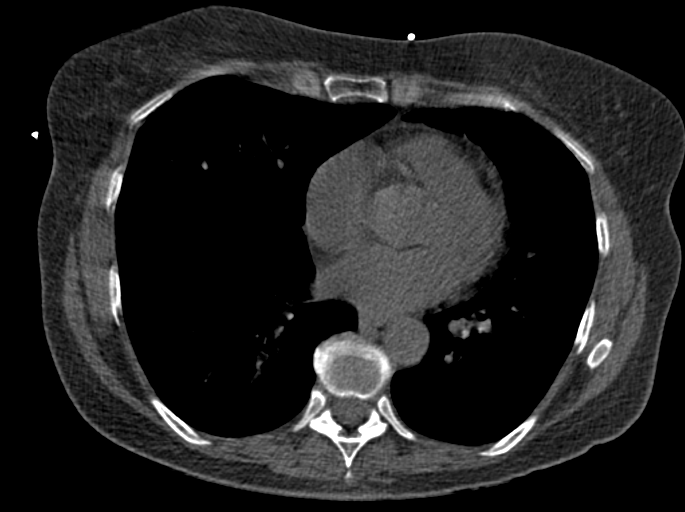
[im 53/80  vessel]
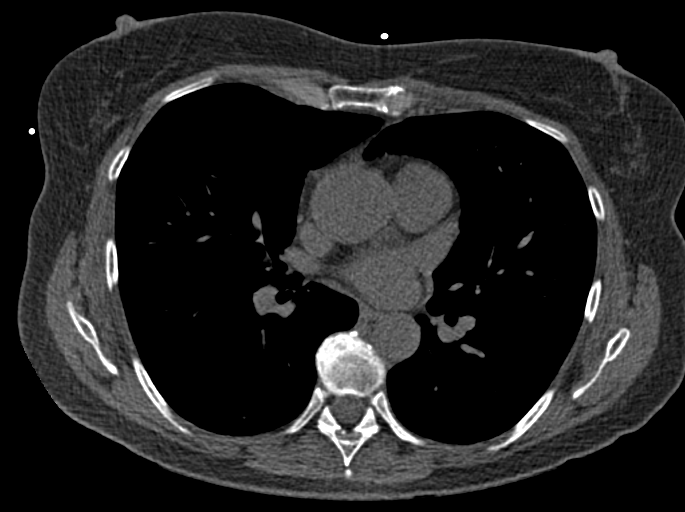
[im 66/80  vessel]
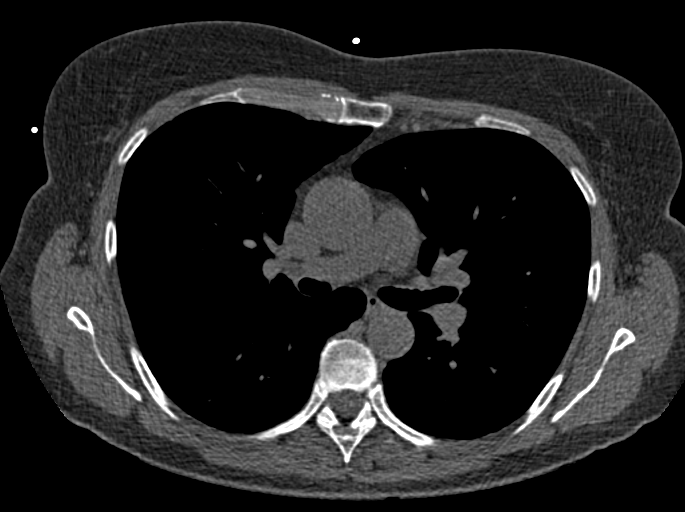
[im 66/80  lung]
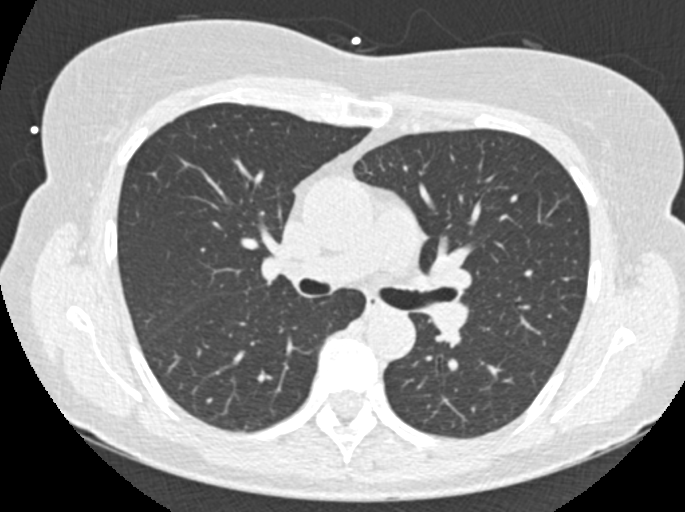

[Series 9: calcium scoring 2.00 br60 bestdiast 71% fov · axial · 0.52mm/px · z∈[+1553,+1657]mm · 5 of 80 slices shown]
[im 14/80  vessel]
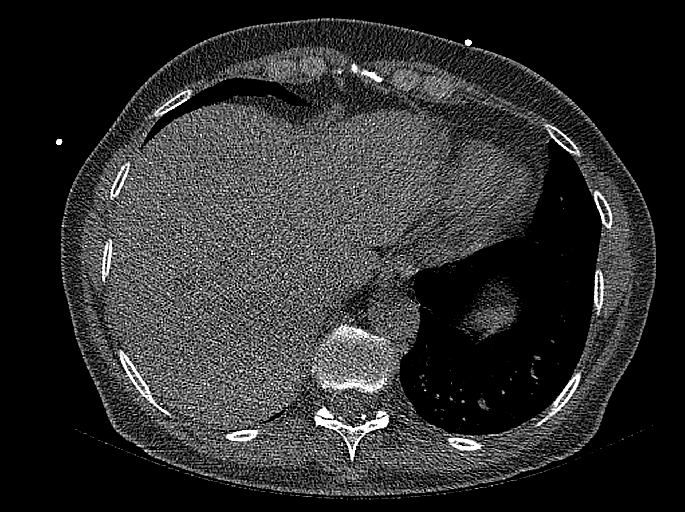
[im 27/80  vessel]
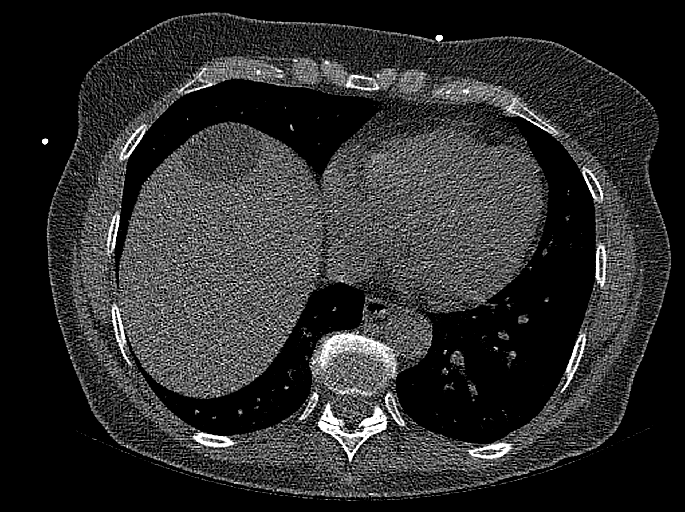
[im 40/80  vessel]
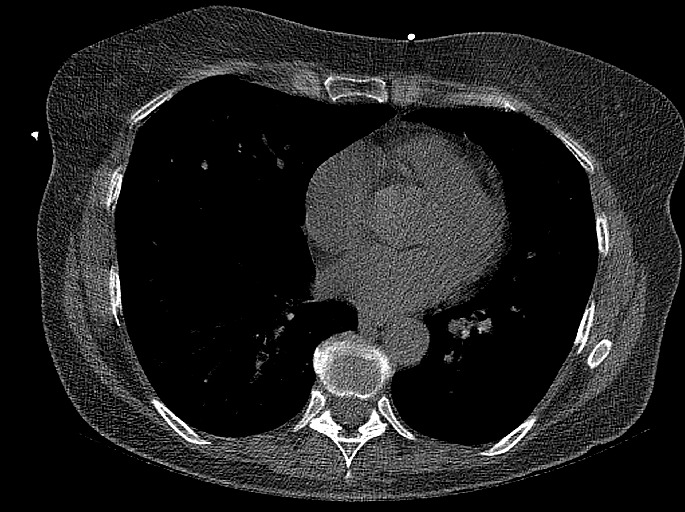
[im 53/80  vessel]
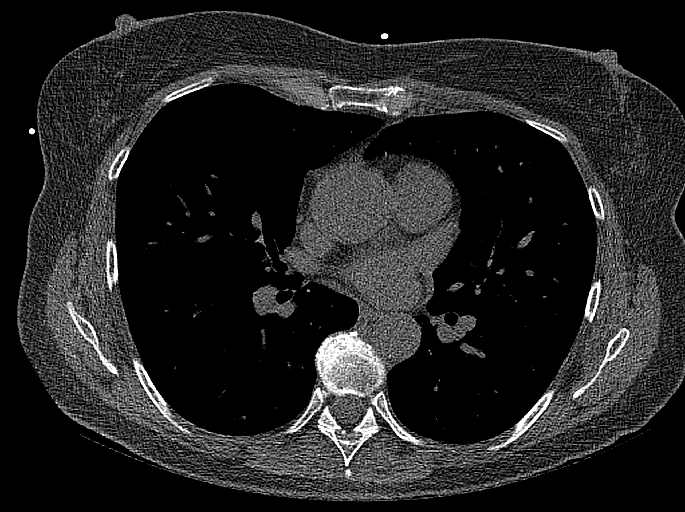
[im 66/80  vessel]
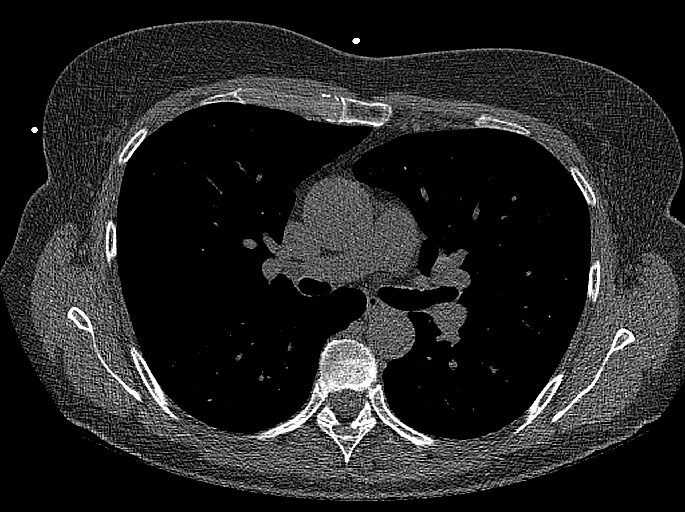

[14 of 20 positions shown; findings below may reference images not displayed]

FINDINGS: CORONARY CALCIUM SCORES:

Left Main: 0

LAD: 0

LCx: 0

RCA: 0

Total Agatston Score: 0

[HOSPITAL] percentile: 0

AORTA MEASUREMENTS:

Ascending Aorta: 37 mm

Descending Aorta: 25 mm

OTHER FINDINGS:

Heart is normal size. No adenopathy in the lower mediastinum or
hila. Visualized lungs clear. No effusions. Multiple low-density
lesions throughout the liver, likely cysts. Chest wall soft tissues
are unremarkable. No acute bony abnormality.
IMPRESSION: No visible coronary artery calcifications. Total coronary calcium
score of 0.

Multiple low-density lesions throughout the liver, most likely
cysts, but difficult to characterized on this noncontrast study.
These could be further evaluated and characterized with right upper
quadrant ultrasound if felt clinically indicated.

## 2021-10-06 DIAGNOSIS — E039 Hypothyroidism, unspecified: Secondary | ICD-10-CM | POA: Diagnosis not present

## 2021-10-06 DIAGNOSIS — Z0000 Encounter for general adult medical examination without abnormal findings: Secondary | ICD-10-CM | POA: Diagnosis not present

## 2021-10-15 DIAGNOSIS — I10 Essential (primary) hypertension: Secondary | ICD-10-CM | POA: Diagnosis not present

## 2021-10-15 DIAGNOSIS — E785 Hyperlipidemia, unspecified: Secondary | ICD-10-CM | POA: Diagnosis not present

## 2021-10-15 DIAGNOSIS — Z0000 Encounter for general adult medical examination without abnormal findings: Secondary | ICD-10-CM | POA: Diagnosis not present

## 2021-10-15 DIAGNOSIS — Z6827 Body mass index (BMI) 27.0-27.9, adult: Secondary | ICD-10-CM | POA: Diagnosis not present

## 2021-10-26 ENCOUNTER — Other Ambulatory Visit: Payer: Self-pay | Admitting: Psychiatry

## 2021-11-26 DIAGNOSIS — Z6827 Body mass index (BMI) 27.0-27.9, adult: Secondary | ICD-10-CM | POA: Diagnosis not present

## 2021-11-26 DIAGNOSIS — Z13820 Encounter for screening for osteoporosis: Secondary | ICD-10-CM | POA: Diagnosis not present

## 2021-11-26 DIAGNOSIS — Z01419 Encounter for gynecological examination (general) (routine) without abnormal findings: Secondary | ICD-10-CM | POA: Diagnosis not present

## 2021-11-26 DIAGNOSIS — Z1231 Encounter for screening mammogram for malignant neoplasm of breast: Secondary | ICD-10-CM | POA: Diagnosis not present

## 2022-01-08 DIAGNOSIS — L718 Other rosacea: Secondary | ICD-10-CM | POA: Diagnosis not present

## 2022-01-13 ENCOUNTER — Other Ambulatory Visit: Payer: Self-pay | Admitting: Psychiatry

## 2022-01-13 NOTE — Telephone Encounter (Signed)
Pt has made an appt 3/29

## 2022-01-13 NOTE — Telephone Encounter (Signed)
Please call to schedule an appt. Last seen in October with RTC in 3 months.

## 2022-02-11 ENCOUNTER — Encounter: Payer: Self-pay | Admitting: Psychiatry

## 2022-02-11 ENCOUNTER — Ambulatory Visit (INDEPENDENT_AMBULATORY_CARE_PROVIDER_SITE_OTHER): Payer: BC Managed Care – PPO | Admitting: Psychiatry

## 2022-02-11 DIAGNOSIS — F325 Major depressive disorder, single episode, in full remission: Secondary | ICD-10-CM

## 2022-02-11 DIAGNOSIS — F902 Attention-deficit hyperactivity disorder, combined type: Secondary | ICD-10-CM | POA: Diagnosis not present

## 2022-02-11 MED ORDER — AMPHETAMINE-DEXTROAMPHETAMINE 5 MG PO TABS: 5.0000 mg | ORAL_TABLET | Freq: Two times a day (BID) | ORAL | 0 refills | Status: DC

## 2022-02-11 NOTE — Progress Notes (Signed)
Angel Cox ?[id number] ?1956/07/08 ?66 y.o. ? ?Subjective:  ? ?Patient ID:  Angel Cox is a 66 y.o. (DOB 08-Oct-1956) female. ? ?Chief Complaint:  ?No chief complaint on file. ? ? ?HPI ?Jariyah Hackley presents to the office today for follow-up of wants to try to taper Lexapro if possible to see if needed.  Also concerns abougt ADD ? ?6/20222 appt. ?Kids think she needs ADD treatment. Scattered.  Has 2 kids with ADD on Adderall or Vyvanse.  Recognizes it affects her in conversation.  Hard to finish projects.  Loses things. Arturo Morton track of task completion.  Overwhelmed at times feeling scattered. ?Started Concerta Start Concerta 36 mg every morning ?Continued Lexapro 20 ? ?08/19/2021 appt noted: ?Not taking Concerta. For 7 days and didn't feel good, head felt tight and less energetic. ?Plan trial Vyvanse low-dose 20 to 30 mg.  Her daughter benefits from Vyvanse. ? ?02/11/2022 appointment with the following noted: ?Didn't feel normal or good on Vyvanse 20 mg daily ?Still problems with concentration ADD preiously noted. ?Still would like to try weaning Lexapro at some point. ? ?Patient reports stable mood and denies depressed or irritable moods.  Patient denies any recent difficulty with anxiety.  Patient denies difficulty with sleep initiation or maintenance. Denies appetite disturbance.  Patient reports that energy and motivation have been good. Patient denies any suicidal ideation. ?  ?Past Psychiatric Medication Trials:  Lexapro, Prozac, Wellbutrin ?Concerta 36 NR and SE ?Vyvanse 20 SE ? ?Review of Systems:  ?Review of Systems  ?Cardiovascular:  Negative for palpitations.  ?Neurological:  Negative for tremors.  ?Psychiatric/Behavioral:  Positive for decreased concentration. The patient is not nervous/anxious.   ? ?Medications: I have reviewed the patient's current medications. ? ?Current Outpatient Medications  ?Medication Sig Dispense Refill  ? ALPRAZolam (XANAX) 1 MG tablet Take 1 tablet as  needed for flying 30 tablet 0  ? atorvastatin (LIPITOR) 10 MG tablet     ? escitalopram (LEXAPRO) 20 MG tablet TAKE 1 TABLET BY MOUTH EVERY DAY 30 tablet 0  ? levothyroxine (SYNTHROID) 150 MCG tablet Take 150 mcg by mouth daily.    ? lisdexamfetamine (VYVANSE) 20 MG capsule Take 1 capsule (20 mg total) by mouth daily. 30 capsule 0  ? lithium carbonate 150 MG capsule TAKE 1 CAPSULE BY MOUTH AT BEDTIME. 90 capsule 3  ? LORazepam (ATIVAN) 1 MG tablet 1 mg.    ? losartan (COZAAR) 25 MG tablet     ? methylphenidate (CONCERTA) 36 MG PO CR tablet Take 1 tablet (36 mg total) by mouth daily. (Patient not taking: Reported on 08/19/2021) 30 tablet 0  ? ?No current facility-administered medications for this visit.  ? ? ?Medication Side Effects: None ? ?Allergies:  ?Allergies  ?Allergen Reactions  ? Latex   ? ? ?Past Medical History:  ?Diagnosis Date  ? Cancer [facility])   ? basel cell face   ? Depression   ? Hyperlipidemia   ? Hypertension   ? Thyroid disease   ? ? ?Past Medical History, Surgical history, Social history, and Family history were reviewed and updated as appropriate.  ? ?Please see review of systems for further details on the patient's review from today.  ? ?Objective:  ? ?Physical Exam:  ?There were no vitals taken for this visit. ? ?Physical Exam ?Constitutional:   ?   General: She is not in acute distress. ?Musculoskeletal:     ?   General: No deformity.  ?Neurological:  ?   Mental Status: She is  alert and oriented to person, place, and time.  ?   Coordination: Coordination normal.  ?Psychiatric:     ?   Attention and Perception: Perception normal. She is inattentive. She does not perceive auditory or visual hallucinations.     ?   Mood and Affect: Mood normal. Mood is not anxious or depressed. Affect is not labile, blunt, angry or inappropriate.     ?   Speech: Speech normal. Speech is not rapid and pressured.     ?   Behavior: Behavior normal.     ?   Thought Content: Thought content normal. Thought content is not  paranoid or delusional. Thought content does not include homicidal or suicidal ideation. Thought content does not include suicidal plan.     ?   Cognition and Memory: Cognition and memory normal.     ?   Judgment: Judgment normal.  ?   Comments: Insight intact  ? ? ?Lab Review:  ?No results found for: NA, K, CL, CO2, GLUCOSE, BUN, CREATININE, CALCIUM, PROT, ALBUMIN, AST, ALT, ALKPHOS, BILITOT, GFRNONAA, GFRAA ? ?No results found for: WBC, RBC, HGB, HCT, PLT, MCV, MCH, MCHC, RDW, LYMPHSABS, MONOABS, EOSABS, BASOSABS ? ?No results found for: POCLITH, LITHIUM  ? ?No results found for: PHENYTOIN, PHENOBARB, VALPROATE, CBMZ  ? ?.res ?Assessment: Plan:   ? ?There are no diagnoses linked to this encounter. ?  ? ?Greater than 50% of face to face time with patient was spent on counseling and coordination of care. We discussed the diagnoses of history of depression and ADHD and their respective treatment options. ?Her depression is under control and has been under control for some time.  She is attempted to wean Lexapro without success in the past but some of what she experienced was SSRI withdrawal.  We discussed this in detail and how in the future when her medicine is weaned to optimize potential success she should reduce by 5 mg every 2 months.  She agrees with this plan.   We discussed the risk of relapse. ? ?All 3 of her children have been diagnosed with ADD.  Her daughter is in the medical field and believes the patient has ADHD.  [name] is known to me outside of the office setting and I have observed her ADHD symptoms as well. ?Adult self report scale for ADD HD score was 31 on the inattentive scale and 19 on the hyperactivity scale which makes the diagnosis highly likely.  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  We discussed this.  We discussed the goals with treatment should include quality of life improvement and ease of productivity.  We discussed potential side effects of both major types of stimulants.   There are nonstimulant options as well.  Her children have benefited from amphetamine-based products.  However she prefers and I would recommend a long-acting ADHD medication.  She will have difficulty complying with twice daily or 3 times daily dosing.   We discussed the side effects in detail including her questions around the risk of elevating blood pressure.  She will monitor for this.  She agrees to the plan. ?Concerta failed DT SE.  Has been intolerant of stimulants so far. ?Adderall 2.5-5 mg BID ? ?If intolerant then [place] ? ?Continue Lexapro 20 mg daily for now and will taper later as noted. ? ?She takes alprazolam for flying phobia and she will be taking a couple of trips upcoming.  She is accustomed to using the medication and tolerates it. ? ?Follow up  in 3 months or so.   ? ?Lynder Parents MD, DFAPA ? ? ? ?Please see After Visit Summary for patient specific instructions. ? ?No future appointments. ? ? ?No orders of the defined types were placed in this encounter. ? ? ?------------------------------- ? ?

## 2022-02-14 ENCOUNTER — Other Ambulatory Visit: Payer: Self-pay | Admitting: Psychiatry

## 2022-05-04 ENCOUNTER — Other Ambulatory Visit: Payer: Self-pay | Admitting: Psychiatry

## 2022-05-04 ENCOUNTER — Telehealth: Payer: Self-pay | Admitting: Psychiatry

## 2022-05-04 DIAGNOSIS — F419 Anxiety disorder, unspecified: Secondary | ICD-10-CM

## 2022-05-04 MED ORDER — LORAZEPAM 1 MG PO TABS: ORAL_TABLET | ORAL | 0 refills | Status: DC

## 2022-05-04 NOTE — Telephone Encounter (Signed)
Last RF on Adderall 3/29.

## 2022-05-04 NOTE — Telephone Encounter (Signed)
She is well known to me.  Her husband is dealing with some significant health problems which is causing intermittent anxiety.  She can take lorazepam 1/2 to 1 tablet as needed.  If that does not work or symptoms get worse let us know.

## 2022-05-04 NOTE — Telephone Encounter (Signed)
See message. Patient said she doesn't remember to take her Adderall most of the time but did take it today and she felt it did make her feel calmer. She said she feels like she only needs something prn.

## 2022-05-04 NOTE — Telephone Encounter (Signed)
Pt LVM at 11:36a.   She wants to know if Dr. Clovis Pu would prescribe something for anxiety for her.  No upcoming appt scheduled.

## 2022-05-05 NOTE — Telephone Encounter (Signed)
LVM to RC 

## 2022-05-06 ENCOUNTER — Other Ambulatory Visit: Payer: Self-pay

## 2022-05-06 DIAGNOSIS — F419 Anxiety disorder, unspecified: Secondary | ICD-10-CM

## 2022-05-06 NOTE — Telephone Encounter (Signed)
Patient notified. She asks for a RF on alprazolam.

## 2022-05-06 NOTE — Telephone Encounter (Signed)
Pended RF.

## 2022-05-11 ENCOUNTER — Telehealth: Payer: Self-pay | Admitting: Psychiatry

## 2022-05-11 ENCOUNTER — Other Ambulatory Visit: Payer: Self-pay | Admitting: Psychiatry

## 2022-05-11 MED ORDER — ALPRAZOLAM 0.5 MG PO TABS: 0.5000 mg | ORAL_TABLET | Freq: Three times a day (TID) | ORAL | 0 refills | Status: DC | PRN

## 2022-05-11 NOTE — Telephone Encounter (Signed)
Too sleepy with lorazepam 1 mg as needed anxiety as opposed to alprazolam.  Okay to switch back to alprazolam 0.5 mg 3 times daily as needed anxiety.  She is under a lot of stress due to to her husband's recent diagnosis with cancer.

## 2022-05-12 NOTE — Telephone Encounter (Signed)
LVM to RC 

## 2022-05-13 NOTE — Telephone Encounter (Signed)
Patient notified

## 2022-05-16 ENCOUNTER — Other Ambulatory Visit: Payer: Self-pay | Admitting: Psychiatry

## 2022-06-10 ENCOUNTER — Other Ambulatory Visit: Payer: Self-pay | Admitting: Psychiatry

## 2022-06-11 ENCOUNTER — Telehealth: Payer: Self-pay | Admitting: Psychiatry

## 2022-06-11 ENCOUNTER — Other Ambulatory Visit: Payer: Self-pay

## 2022-06-11 MED ORDER — AMPHETAMINE-DEXTROAMPHETAMINE 5 MG PO TABS: 5.0000 mg | ORAL_TABLET | Freq: Two times a day (BID) | ORAL | 0 refills | Status: DC

## 2022-06-11 NOTE — Telephone Encounter (Signed)
Pt requesting Rx for GENERIC ADDERALL 5 mg @ [facility]  [place]. Have in stock

## 2022-06-11 NOTE — Telephone Encounter (Signed)
Pended.

## 2022-09-11 ENCOUNTER — Other Ambulatory Visit: Payer: Self-pay

## 2022-09-11 ENCOUNTER — Telehealth: Payer: Self-pay | Admitting: Psychiatry

## 2022-09-11 MED ORDER — AMPHETAMINE-DEXTROAMPHETAMINE 5 MG PO TABS: 5.0000 mg | ORAL_TABLET | Freq: Two times a day (BID) | ORAL | 0 refills | Status: AC

## 2022-09-11 NOTE — Telephone Encounter (Signed)
Pended.

## 2022-09-11 NOTE — Telephone Encounter (Signed)
Pt Lvm @ 3:40p.  She said she wanted refill of Adderall sent to [place].  It is available there.  No upcoming appts scheduled.

## 2022-10-02 DIAGNOSIS — H1033 Unspecified acute conjunctivitis, bilateral: Secondary | ICD-10-CM | POA: Diagnosis not present

## 2022-10-27 DIAGNOSIS — H2513 Age-related nuclear cataract, bilateral: Secondary | ICD-10-CM | POA: Diagnosis not present

## 2022-11-11 ENCOUNTER — Other Ambulatory Visit: Payer: Self-pay | Admitting: Psychiatry

## 2022-11-11 MED ORDER — ESCITALOPRAM OXALATE 20 MG PO TABS: 20.0000 mg | ORAL_TABLET | Freq: Every day | ORAL | 0 refills | Status: DC

## 2022-12-03 ENCOUNTER — Other Ambulatory Visit: Payer: Self-pay | Admitting: Psychiatry

## 2022-12-21 DIAGNOSIS — E039 Hypothyroidism, unspecified: Secondary | ICD-10-CM | POA: Diagnosis not present

## 2022-12-21 DIAGNOSIS — Z0000 Encounter for general adult medical examination without abnormal findings: Secondary | ICD-10-CM | POA: Diagnosis not present

## 2022-12-28 DIAGNOSIS — Z23 Encounter for immunization: Secondary | ICD-10-CM | POA: Diagnosis not present

## 2022-12-28 DIAGNOSIS — E039 Hypothyroidism, unspecified: Secondary | ICD-10-CM | POA: Diagnosis not present

## 2022-12-28 DIAGNOSIS — Z8249 Family history of ischemic heart disease and other diseases of the circulatory system: Secondary | ICD-10-CM | POA: Diagnosis not present

## 2022-12-28 DIAGNOSIS — E785 Hyperlipidemia, unspecified: Secondary | ICD-10-CM | POA: Diagnosis not present

## 2022-12-28 DIAGNOSIS — Z0000 Encounter for general adult medical examination without abnormal findings: Secondary | ICD-10-CM | POA: Diagnosis not present

## 2023-01-07 DIAGNOSIS — Z124 Encounter for screening for malignant neoplasm of cervix: Secondary | ICD-10-CM | POA: Diagnosis not present

## 2023-01-07 DIAGNOSIS — Z01419 Encounter for gynecological examination (general) (routine) without abnormal findings: Secondary | ICD-10-CM | POA: Diagnosis not present

## 2023-01-07 DIAGNOSIS — Z6827 Body mass index (BMI) 27.0-27.9, adult: Secondary | ICD-10-CM | POA: Diagnosis not present

## 2023-01-07 DIAGNOSIS — Z1231 Encounter for screening mammogram for malignant neoplasm of breast: Secondary | ICD-10-CM | POA: Diagnosis not present

## 2023-01-07 DIAGNOSIS — Z1151 Encounter for screening for human papillomavirus (HPV): Secondary | ICD-10-CM | POA: Diagnosis not present

## 2023-04-29 ENCOUNTER — Other Ambulatory Visit: Payer: Self-pay | Admitting: Psychiatry

## 2023-04-29 NOTE — Telephone Encounter (Signed)
Please call to schedule an appt, past due.  

## 2023-06-16 ENCOUNTER — Other Ambulatory Visit: Payer: Self-pay | Admitting: Psychiatry

## 2023-07-02 DIAGNOSIS — E785 Hyperlipidemia, unspecified: Secondary | ICD-10-CM | POA: Diagnosis not present

## 2023-07-02 DIAGNOSIS — R350 Frequency of micturition: Secondary | ICD-10-CM | POA: Diagnosis not present

## 2023-07-14 DIAGNOSIS — R87610 Atypical squamous cells of undetermined significance on cytologic smear of cervix (ASC-US): Secondary | ICD-10-CM | POA: Diagnosis not present

## 2023-07-28 ENCOUNTER — Other Ambulatory Visit: Payer: Self-pay | Admitting: Psychiatry

## 2023-07-28 DIAGNOSIS — R87610 Atypical squamous cells of undetermined significance on cytologic smear of cervix (ASC-US): Secondary | ICD-10-CM | POA: Diagnosis not present

## 2023-07-28 DIAGNOSIS — N870 Mild cervical dysplasia: Secondary | ICD-10-CM | POA: Diagnosis not present

## 2023-07-28 DIAGNOSIS — N879 Dysplasia of cervix uteri, unspecified: Secondary | ICD-10-CM | POA: Diagnosis not present

## 2023-07-29 NOTE — Telephone Encounter (Signed)
Please call to schedule an appt, is past due.  

## 2023-08-05 DIAGNOSIS — N879 Dysplasia of cervix uteri, unspecified: Secondary | ICD-10-CM | POA: Diagnosis not present

## 2023-08-13 NOTE — Telephone Encounter (Signed)
Pt is scheduled 10/31 

## 2023-08-18 DIAGNOSIS — N86 Erosion and ectropion of cervix uteri: Secondary | ICD-10-CM | POA: Diagnosis not present

## 2023-08-18 DIAGNOSIS — R87610 Atypical squamous cells of undetermined significance on cytologic smear of cervix (ASC-US): Secondary | ICD-10-CM | POA: Diagnosis not present

## 2023-09-09 DIAGNOSIS — E786 Lipoprotein deficiency: Secondary | ICD-10-CM | POA: Diagnosis not present

## 2023-09-16 ENCOUNTER — Ambulatory Visit: Payer: BC Managed Care – PPO | Admitting: Psychiatry

## 2023-11-16 ENCOUNTER — Ambulatory Visit: Payer: BC Managed Care – PPO | Admitting: Psychiatry

## 2023-12-04 ENCOUNTER — Other Ambulatory Visit: Payer: Self-pay | Admitting: Psychiatry

## 2023-12-06 ENCOUNTER — Other Ambulatory Visit: Payer: Self-pay | Admitting: Psychiatry

## 2023-12-06 ENCOUNTER — Telehealth: Payer: Self-pay | Admitting: Psychiatry

## 2023-12-06 ENCOUNTER — Other Ambulatory Visit: Payer: Self-pay

## 2023-12-06 MED ORDER — ESCITALOPRAM OXALATE 20 MG PO TABS: 20.0000 mg | ORAL_TABLET | Freq: Every day | ORAL | 2 refills | Status: DC

## 2023-12-06 NOTE — Telephone Encounter (Signed)
Please schedule patient an appt. LV 02/11/22

## 2023-12-06 NOTE — Telephone Encounter (Signed)
SENT. LEXAPRO 20 MG TO [facility].

## 2023-12-06 NOTE — Telephone Encounter (Signed)
Pt called and needs a refill on her lexapro 20 mg. She has an appt in march. [facility] [place]

## 2023-12-09 ENCOUNTER — Telehealth: Payer: Self-pay | Admitting: Psychiatry

## 2023-12-09 ENCOUNTER — Other Ambulatory Visit: Payer: Self-pay

## 2023-12-09 MED ORDER — ALPRAZOLAM 0.5 MG PO TABS: 0.5000 mg | ORAL_TABLET | Freq: Three times a day (TID) | ORAL | 0 refills | Status: AC | PRN

## 2023-12-09 NOTE — Telephone Encounter (Signed)
Pended alprazolam 0.5 mg to [facility]

## 2023-12-09 NOTE — Telephone Encounter (Signed)
Pt is scheduled 02/03/24

## 2023-12-09 NOTE — Telephone Encounter (Signed)
Pt called at 9:56a requesting a refill of Xanax to   [id number] [place], [place] - [address] [place], [place] [address] Phone: [phone]  Fax: [phone]   She is leaving to to [place] and she takes the meds for flying phobia.  She is leaving Jan 29.  Next appt 3/20

## 2023-12-30 ENCOUNTER — Other Ambulatory Visit: Payer: Self-pay | Admitting: Psychiatry

## 2024-02-03 ENCOUNTER — Ambulatory Visit: Payer: Self-pay | Admitting: Psychiatry

## 2024-02-03 ENCOUNTER — Other Ambulatory Visit: Payer: Self-pay | Admitting: Psychiatry

## 2024-05-13 ENCOUNTER — Other Ambulatory Visit: Payer: Self-pay | Admitting: Psychiatry

## 2024-05-14 NOTE — Telephone Encounter (Signed)
 Please call to schedule FU

## 2024-05-15 NOTE — Telephone Encounter (Signed)
 Pt appt is 7/2

## 2024-05-17 ENCOUNTER — Encounter: Payer: Self-pay | Admitting: Psychiatry

## 2024-05-17 ENCOUNTER — Ambulatory Visit (INDEPENDENT_AMBULATORY_CARE_PROVIDER_SITE_OTHER): Payer: Self-pay | Admitting: Psychiatry

## 2024-05-17 DIAGNOSIS — F902 Attention-deficit hyperactivity disorder, combined type: Secondary | ICD-10-CM

## 2024-05-17 DIAGNOSIS — F411 Generalized anxiety disorder: Secondary | ICD-10-CM | POA: Diagnosis not present

## 2024-05-17 NOTE — Progress Notes (Signed)
 Angel Cox [id number] Jun 26, 1956 68 y.o.  Subjective:   Patient ID:  Angel Cox is a 68 y.o. (DOB 1956/01/11) female.  Chief Complaint:  No chief complaint on file.   HPI [place] Brogden presents to the office today for follow-up of wants to try to taper Lexapro  if possible to see if needed.  Also concerns abougt ADD  6/20222 appt. Kids think she needs ADD treatment. Scattered.  Has 2 kids with ADD on Adderall or Vyvanse .  Recognizes it affects her in conversation.  Hard to finish projects.  Loses things. [employer] track of task completion.  Overwhelmed at times feeling scattered. Started Concerta  Start Concerta  36 mg every morning Continued Lexapro  20  08/19/2021 appt noted: Not taking Concerta . For 7 days and didn't feel good, head felt tight and less energetic. Plan trial Vyvanse  low-dose 20 to 30 mg.  Her daughter benefits from Vyvanse .  02/11/2022 appointment with the following noted: Didn't feel normal or good on Vyvanse  20 mg daily Still problems with concentration ADD preiously noted. Still would like to try weaning Lexapro  at some point. Plan: Adderall 2.5-5 mg BID If intolerant then Evekeo  Lexapro  20 mg daily  05/17/24 appt noted:  Med: Lexapro  20 mg daily, rare  Adderall, rare Xanax  0.5 mg prn anxiety Patient reports stable mood and denies depressed or irritable moods.  Patient denies any recent difficulty with anxiety.  Patient denies difficulty with sleep initiation or maintenance. Denies appetite disturbance.  Patient reports that energy and motivation have been good.  Patient denies any difficulty with concentration.  Patient denies any suicidal ideation. Adderall helps focus and productivity and calms her but only used rarely.  Wants to continu without change but try weaning Lexapro  after summer trips.  Past Psychiatric Medication Trials:  Lexapro , Prozac, Wellbutrin Concerta  36 NR and SE Vyvanse  20 SE  Review of Systems:  Review of Systems   Cardiovascular:  Negative for palpitations.  Neurological:  Negative for tremors.  Psychiatric/Behavioral:  Positive for decreased concentration. The patient is not nervous/anxious.     Medications: I have reviewed the patient's current medications.  Current Outpatient Medications  Medication Sig Dispense Refill   ALPRAZolam  (XANAX ) 0.5 MG tablet Take 1 tablet (0.5 mg total) by mouth 3 (three) times daily as needed for anxiety. 40 tablet 0   amphetamine -dextroamphetamine  (ADDERALL) 5 MG tablet Take 1 tablet (5 mg total) by mouth 2 (two) times daily with a meal. 60 tablet 0   atorvastatin (LIPITOR) 10 MG tablet      escitalopram  (LEXAPRO ) 20 MG tablet TAKE 1 TABLET BY MOUTH EVERY DAY 30 tablet 0   levothyroxine (SYNTHROID) 150 MCG tablet Take 150 mcg by mouth daily.     lisdexamfetamine (VYVANSE ) 20 MG capsule Take 1 capsule (20 mg total) by mouth daily. (Patient not taking: Reported on 02/11/2022) 30 capsule 0   lithium  carbonate 150 MG capsule TAKE 1 CAPSULE BY MOUTH AT BEDTIME. 90 capsule 3   losartan (COZAAR) 25 MG tablet      No current facility-administered medications for this visit.    Medication Side Effects: None  Allergies:  Allergies  Allergen Reactions   Latex     Past Medical History:  Diagnosis Date   Cancer (HCC)    basel cell face    Depression    Hyperlipidemia    Hypertension    Thyroid  disease     Past Medical History, Surgical history, Social history, and Family history were reviewed and updated as appropriate.   Please  see review of systems for further details on the patient's review from today.   Objective:   Physical Exam:  There were no vitals taken for this visit.  Physical Exam Constitutional:      General: She is not in acute distress. Musculoskeletal:        General: No deformity.  Neurological:     Mental Status: She is alert and oriented to person, place, and time.     Coordination: Coordination normal.  Psychiatric:         Attention and Perception: Perception normal. She is inattentive. She does not perceive auditory or visual hallucinations.        Mood and Affect: Mood normal. Mood is not anxious or depressed. Affect is not labile, blunt, angry or inappropriate.        Speech: Speech normal. Speech is not rapid and pressured.        Behavior: Behavior normal.        Thought Content: Thought content normal. Thought content is not paranoid or delusional. Thought content does not include homicidal or suicidal ideation. Thought content does not include suicidal plan.        Cognition and Memory: Cognition and memory normal.        Judgment: Judgment normal.     Comments: Insight intact     Lab Review:  No results found for: NA, K, CL, CO2, GLUCOSE, BUN, CREATININE, CALCIUM, PROT, ALBUMIN, AST, ALT, ALKPHOS, BILITOT, GFRNONAA, GFRAA  No results found for: WBC, RBC, HGB, HCT, PLT, MCV, MCH, MCHC, RDW, LYMPHSABS, MONOABS, EOSABS, BASOSABS  No results found for: POCLITH, LITHIUM    No results found for: PHENYTOIN, PHENOBARB, VALPROATE, CBMZ   .res Assessment: Plan:    There are no diagnoses linked to this encounter.    Greater than 50% of face to face time with patient was spent on counseling and coordination of care. We discussed the diagnoses of history of depression and ADHD and their respective treatment options. Her depression is under control and has been under control for some time.  She is attempted to wean Lexapro  without success in the past but some of what she experienced was SSRI withdrawal.  We discussed this in detail and how in the future when her medicine is weaned to optimize potential success she should reduce by 5 mg every 2 months.  She agrees with this plan.   We discussed the risk of relapse.  All 3 of her children have been diagnosed with ADD.  Her daughter is in the medical field and believes the patient has ADHD.   Maggie is known to me outside of the office setting and I have observed her ADHD symptoms as well. Adult self report scale for ADD HD score was 31 on the inattentive scale and 19 on the hyperactivity scale which makes the diagnosis highly likely.  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  We discussed this.  We discussed the goals with treatment should include quality of life improvement and ease of productivity.  We discussed potential side effects of both major types of stimulants.  There are nonstimulant options as well.  Her children have benefited from amphetamine -based products.  However she prefers and I would recommend a long-acting ADHD medication.  She will have difficulty complying with twice daily or 3 times daily dosing.   We discussed the side effects in detail including her questions around the risk of elevating blood pressure.  She will monitor for this.  She agrees to the  plan. Concerta  failed DT SE. Benefit Adderall Adderall 2.5-5 mg BID  If intolerant then Evekeo   Continue Lexapro  20 mg daily for now and will taper later as noted.  After summer.    She takes alprazolam  for flying phobia and she will be taking a couple of trips upcoming.  She is accustomed to using the medication and tolerates it.  Follow up imos   Lorene Macintosh MD, DFAPA    Please see After Visit Summary for patient specific instructions.  No future appointments.   No orders of the defined types were placed in this encounter.   -------------------------------

## 2024-06-07 ENCOUNTER — Other Ambulatory Visit: Payer: Self-pay | Admitting: Psychiatry

## 2024-07-10 ENCOUNTER — Telehealth: Payer: Self-pay | Admitting: Psychiatry

## 2024-07-10 ENCOUNTER — Other Ambulatory Visit: Payer: Self-pay | Admitting: Psychiatry

## 2024-07-10 MED ORDER — LITHIUM CARBONATE 150 MG PO CAPS: 150.0000 mg | ORAL_CAPSULE | Freq: Every day | ORAL | 1 refills | Status: AC

## 2024-07-10 NOTE — Telephone Encounter (Addendum)
 Last prescribed lithium  in 2023 and is not mentioned in most recent note. LVM to [facility] to verify what medication she is wanting.

## 2024-07-10 NOTE — Telephone Encounter (Signed)
 Pt left message she wanted a RF of lithium . This was last prescribed in 2023. She said she doesn't take it often, just to protect her brain cells.  I question if she meant Adderall.

## 2024-07-10 NOTE — Telephone Encounter (Signed)
 sent

## 2024-07-10 NOTE — Telephone Encounter (Signed)
 Pt left a vm at 12:52p saying her Lithium  is not being filled and wants to make sure Dr. Geoffry can get that in.   [facility] [address]  [place]

## 2024-12-07 ENCOUNTER — Other Ambulatory Visit: Payer: Self-pay | Admitting: Psychiatry

## 2024-12-07 ENCOUNTER — Other Ambulatory Visit: Payer: Self-pay

## 2024-12-07 MED ORDER — ESCITALOPRAM OXALATE 20 MG PO TABS: 20.0000 mg | ORAL_TABLET | Freq: Every day | ORAL | 1 refills | Status: DC

## 2024-12-11 ENCOUNTER — Other Ambulatory Visit: Payer: Self-pay

## 2024-12-11 ENCOUNTER — Other Ambulatory Visit: Payer: Self-pay | Admitting: Psychiatry

## 2024-12-11 MED ORDER — ESCITALOPRAM OXALATE 20 MG PO TABS: 20.0000 mg | ORAL_TABLET | Freq: Every day | ORAL | 1 refills | Status: AC
